# Patient Record
Sex: Female | Born: 1980 | Race: Black or African American | Hispanic: No | Smoking: Never smoker
Health system: Southern US, Community
[De-identification: ages and names within clinical notes are randomized; demographics above are authoritative.]

## PROBLEM LIST (undated history)

## (undated) ENCOUNTER — Inpatient Hospital Stay (HOSPITAL_COMMUNITY): Payer: Self-pay

## (undated) DIAGNOSIS — R7689 Other specified abnormal immunological findings in serum: Secondary | ICD-10-CM

## (undated) DIAGNOSIS — R635 Abnormal weight gain: Secondary | ICD-10-CM

## (undated) DIAGNOSIS — O24419 Gestational diabetes mellitus in pregnancy, unspecified control: Secondary | ICD-10-CM

## (undated) DIAGNOSIS — R768 Other specified abnormal immunological findings in serum: Secondary | ICD-10-CM

## (undated) DIAGNOSIS — Z975 Presence of (intrauterine) contraceptive device: Secondary | ICD-10-CM

## (undated) DIAGNOSIS — G43909 Migraine, unspecified, not intractable, without status migrainosus: Secondary | ICD-10-CM

## (undated) DIAGNOSIS — Z3046 Encounter for surveillance of implantable subdermal contraceptive: Secondary | ICD-10-CM

## (undated) HISTORY — DX: Gestational diabetes mellitus in pregnancy, unspecified control: O24.419

## (undated) HISTORY — DX: Other specified abnormal immunological findings in serum: R76.89

## (undated) HISTORY — DX: Presence of (intrauterine) contraceptive device: Z97.5

## (undated) HISTORY — PX: OTHER SURGICAL HISTORY: SHX169

## (undated) HISTORY — DX: Encounter for surveillance of implantable subdermal contraceptive: Z30.46

## (undated) HISTORY — DX: Other specified abnormal immunological findings in serum: R76.8

## (undated) HISTORY — DX: Migraine, unspecified, not intractable, without status migrainosus: G43.909

## (undated) HISTORY — DX: Abnormal weight gain: R63.5

---

## 2001-12-16 ENCOUNTER — Encounter: Payer: Self-pay | Admitting: *Deleted

## 2001-12-16 ENCOUNTER — Ambulatory Visit (HOSPITAL_COMMUNITY): Admission: RE | Admit: 2001-12-16 | Discharge: 2001-12-16 | Payer: Self-pay | Admitting: *Deleted

## 2002-01-30 ENCOUNTER — Inpatient Hospital Stay (HOSPITAL_COMMUNITY): Admission: AD | Admit: 2002-01-30 | Discharge: 2002-02-02 | Payer: Self-pay | Admitting: *Deleted

## 2002-07-16 ENCOUNTER — Emergency Department (HOSPITAL_COMMUNITY): Admission: EM | Admit: 2002-07-16 | Discharge: 2002-07-17 | Payer: Self-pay | Admitting: Emergency Medicine

## 2002-07-31 ENCOUNTER — Emergency Department (HOSPITAL_COMMUNITY): Admission: EM | Admit: 2002-07-31 | Discharge: 2002-07-31 | Payer: Self-pay | Admitting: Emergency Medicine

## 2005-10-03 ENCOUNTER — Emergency Department (HOSPITAL_COMMUNITY): Admission: EM | Admit: 2005-10-03 | Discharge: 2005-10-03 | Payer: Self-pay | Admitting: Emergency Medicine

## 2005-12-03 ENCOUNTER — Emergency Department (HOSPITAL_COMMUNITY): Admission: EM | Admit: 2005-12-03 | Discharge: 2005-12-03 | Payer: Self-pay | Admitting: Family Medicine

## 2005-12-07 ENCOUNTER — Emergency Department (HOSPITAL_COMMUNITY): Admission: EM | Admit: 2005-12-07 | Discharge: 2005-12-07 | Payer: Self-pay | Admitting: Family Medicine

## 2005-12-23 ENCOUNTER — Emergency Department (HOSPITAL_COMMUNITY): Admission: EM | Admit: 2005-12-23 | Discharge: 2005-12-23 | Payer: Self-pay | Admitting: Family Medicine

## 2006-04-18 ENCOUNTER — Emergency Department (HOSPITAL_COMMUNITY): Admission: EM | Admit: 2006-04-18 | Discharge: 2006-04-19 | Payer: Self-pay | Admitting: Emergency Medicine

## 2007-01-08 ENCOUNTER — Emergency Department (HOSPITAL_COMMUNITY): Admission: EM | Admit: 2007-01-08 | Discharge: 2007-01-08 | Payer: Self-pay | Admitting: Emergency Medicine

## 2008-02-12 IMAGING — CT CT PELVIS W/ CM
2 of 5 series · 17 of 46 positions shown, 19 images · IV contrast (omnipaque)
Comparison: none

CLINICAL DATA: Stomach and abdominal pain.
ABDOMEN CT WITH CONTRAST:
TECHNIQUE: Multidetector CT imaging of the abdomen was performed following the standard protocol during bolus administration of intravenous contrast.
Contrast:  125 cc Omnipaque 300
TECHNIQUE: Multidetector CT imaging of the pelvis was performed following the standard protocol during bolus administration of intravenous contrast.

[Series 2: abd_pel 5.0 b40f st · axial · 0.60mm/px · z∈[-614,-228]mm · 14 of 87 slices shown, 16 images]
[im 5/87  soft-tissue]
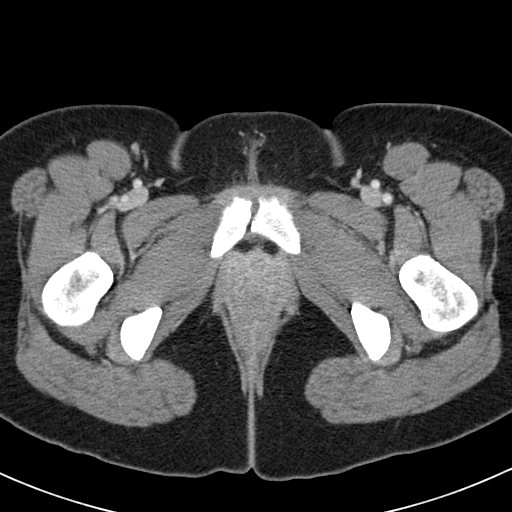
[im 5/87  bone]
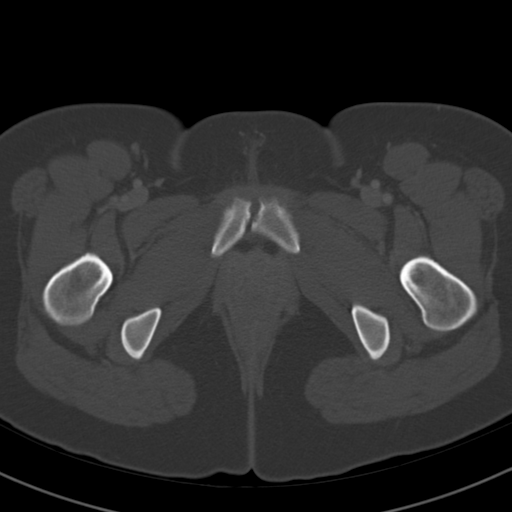
[im 13/87  soft-tissue]
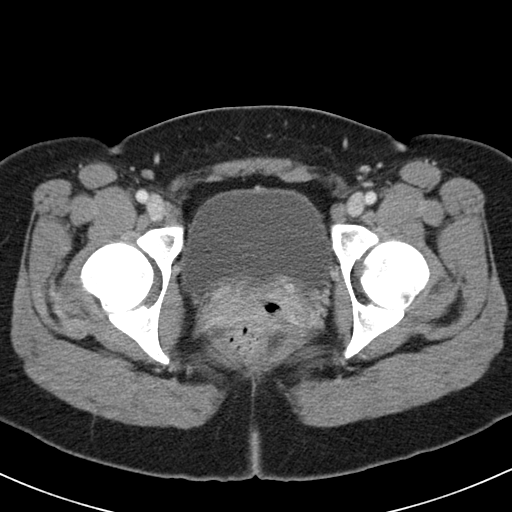
[im 18/87  soft-tissue]
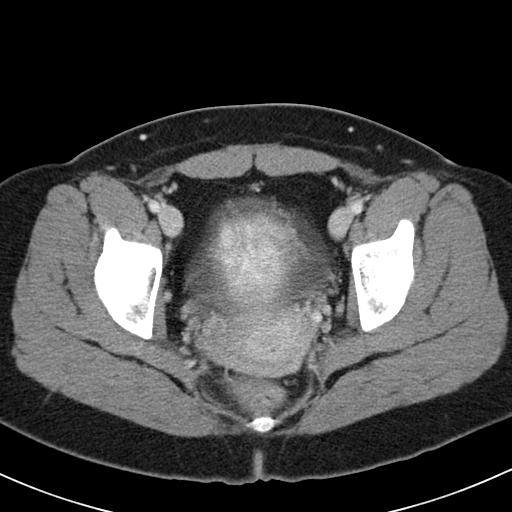
[im 22/87  soft-tissue]
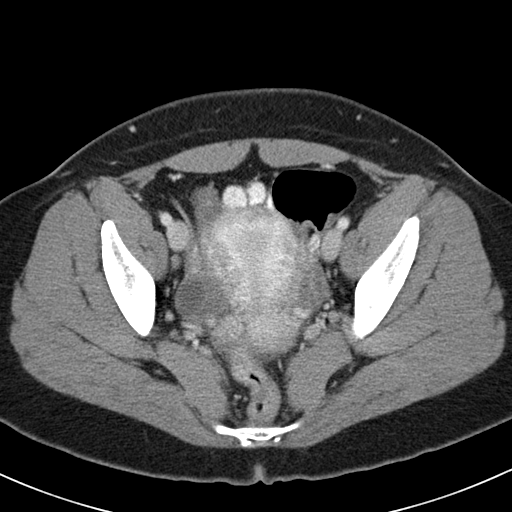
[im 31/87  soft-tissue]
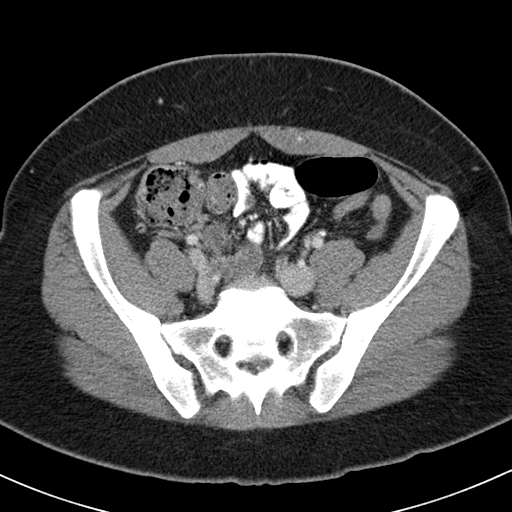
[im 35/87  soft-tissue]
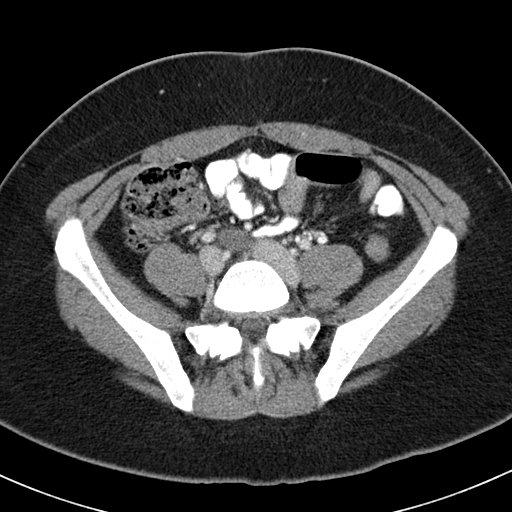
[im 39/87  soft-tissue]
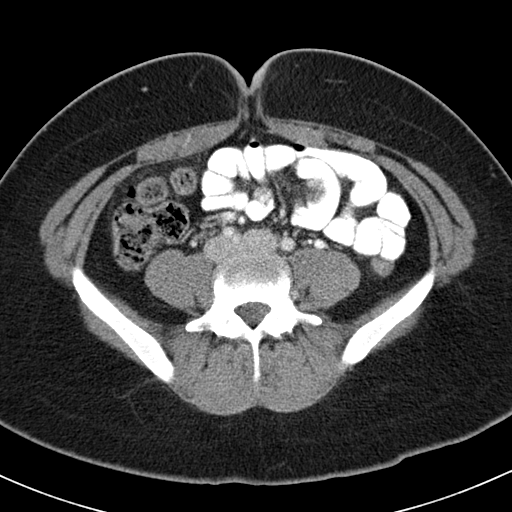
[im 48/87  soft-tissue]
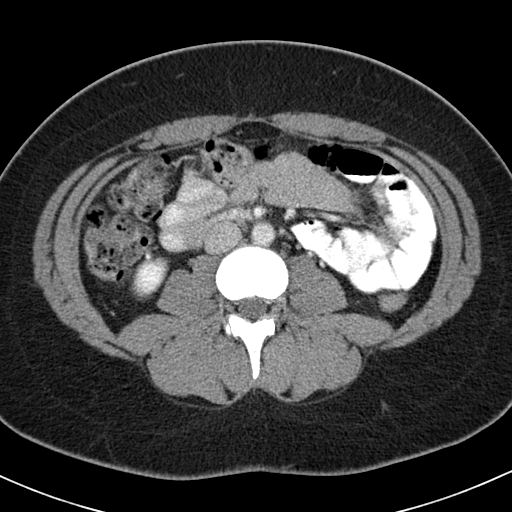
[im 52/87  soft-tissue]
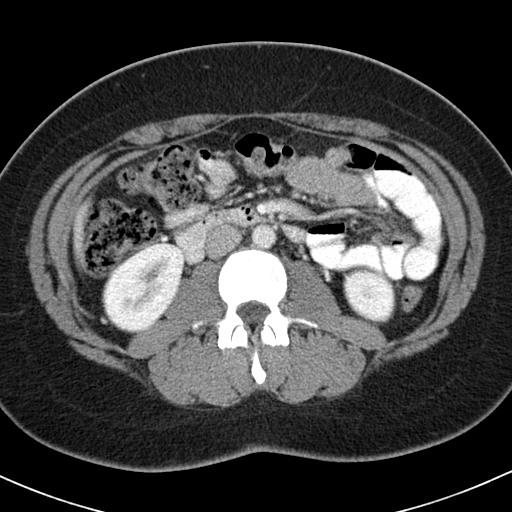
[im 52/87  bone]
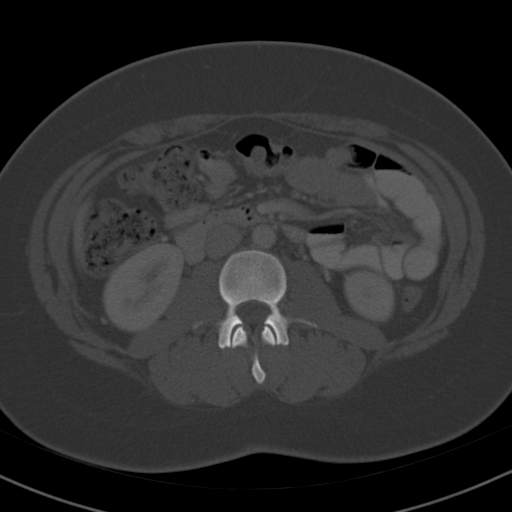
[im 56/87  soft-tissue]
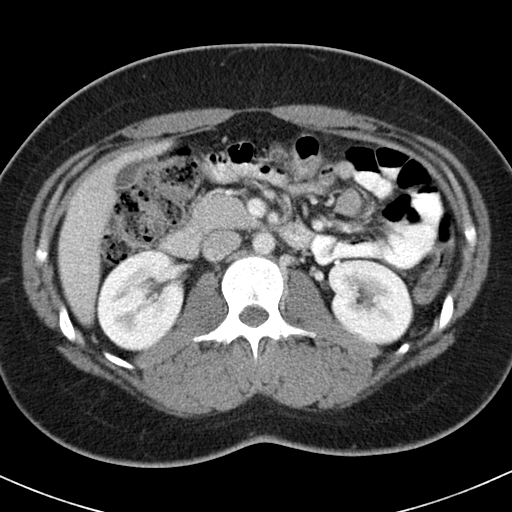
[im 65/87  soft-tissue]
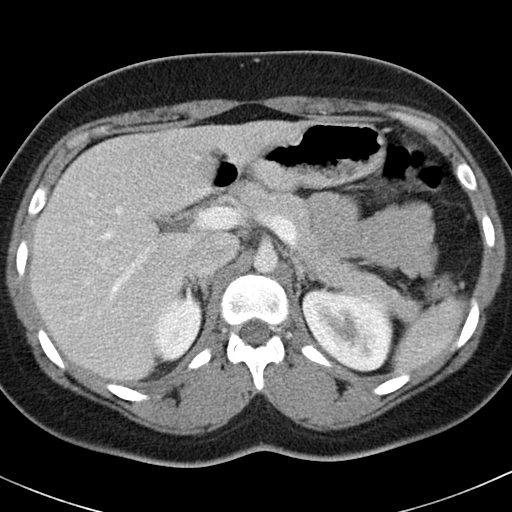
[im 69/87  soft-tissue]
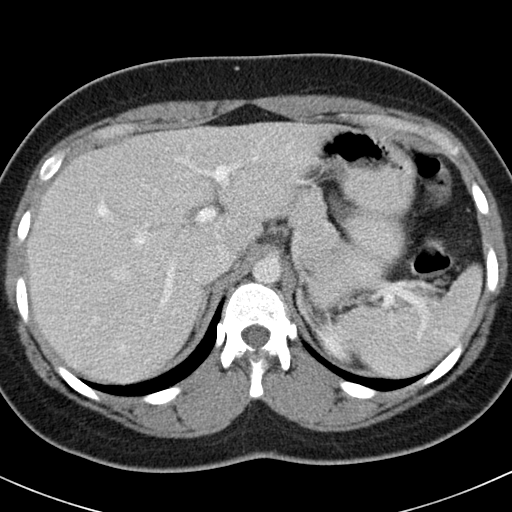
[im 74/87  soft-tissue]
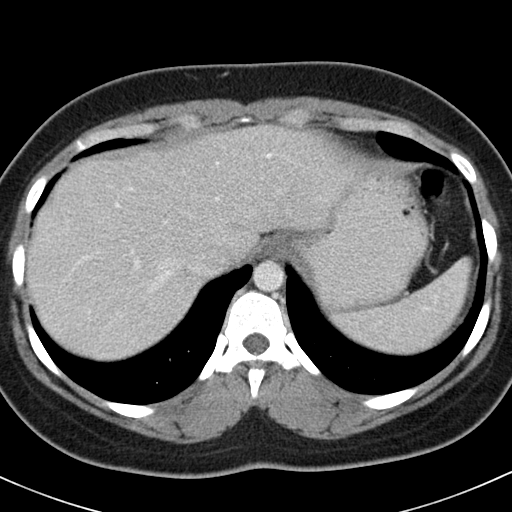
[im 82/87  soft-tissue]
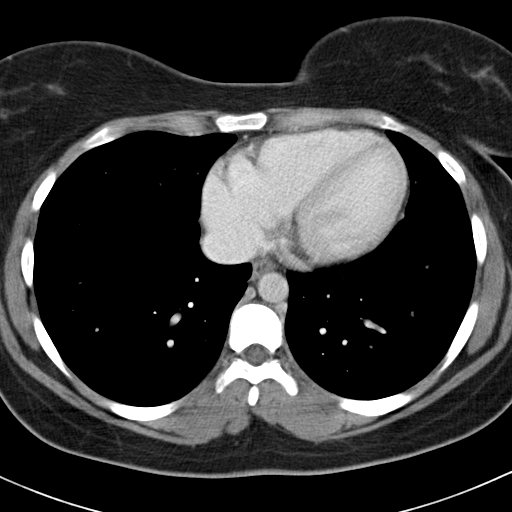

[Series 602: <mpr range> · coronal · 0.88mm/px · 3 of 66 slices shown]
[im 22/66  soft-tissue]
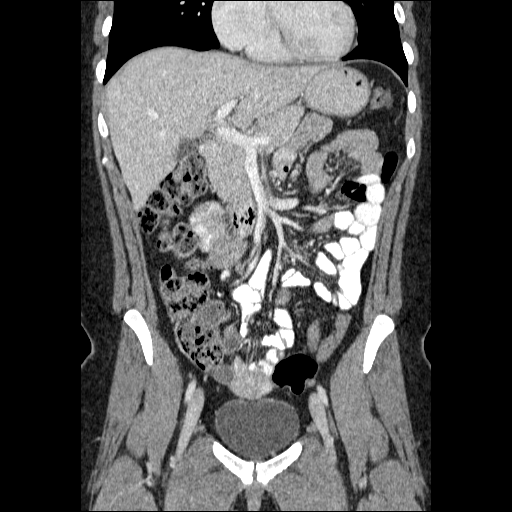
[im 29/66  soft-tissue]
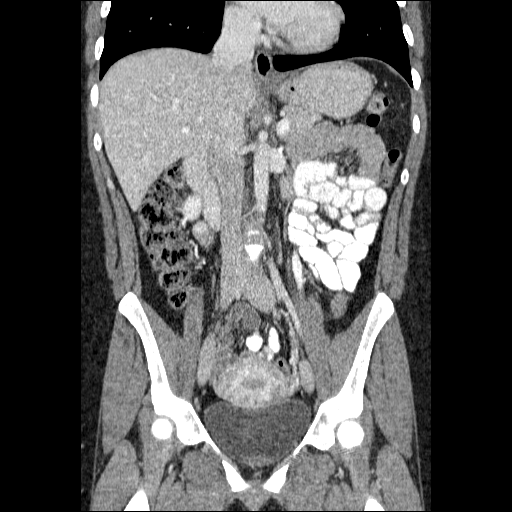
[im 37/66  soft-tissue]
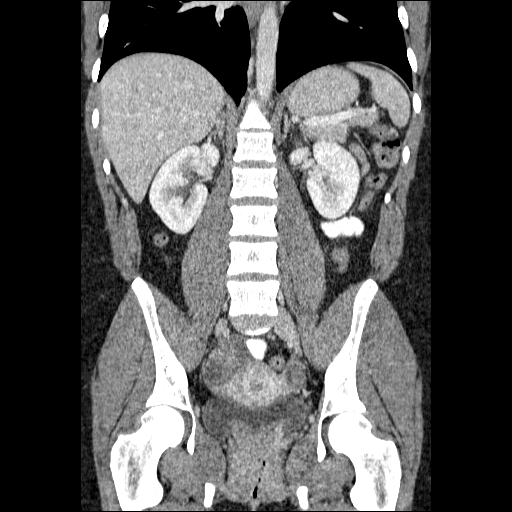

[17 of 46 positions shown; findings below may reference images not displayed]

FINDINGS: The lung bases are clear. Negative for free air.  Liver, gallbladder, portal venous system, spleen, and small accessory spleens are within normal limits. The duodenum, pancreas, and stomach are within normal limits.  Normal appearance of the adrenal glands and kidneys.  Negative for free fluid or lymphadenopathy. Appendix has a normal appearance.  No acute bone abnormalities.
IMPRESSION: Negative CT of the abdomen.
PELVIS CT WITH CONTRAST:
FINDINGS: Ill-defined ovoid structure involving the right adnexa measures 2.5 cm with low density.  Findings consistent with a small cyst.  There is a moderate amount of fluid within the endometrial canal.  Left adnexa has a normal appearance.  No significant free fluid or lymphadenopathy. Urinary bladder is within normal limits. No acute bone abnormalities.
IMPRESSION: 1.  2.5 cm cystic structure involving the right adnexa.  This could be followed with ultrasound.
2.  No acute findings in the pelvis.

## 2009-02-04 ENCOUNTER — Inpatient Hospital Stay (HOSPITAL_COMMUNITY): Admission: AD | Admit: 2009-02-04 | Discharge: 2009-02-04 | Payer: Self-pay | Admitting: Obstetrics & Gynecology

## 2009-08-10 ENCOUNTER — Ambulatory Visit: Payer: Self-pay | Admitting: Obstetrics and Gynecology

## 2009-08-10 ENCOUNTER — Inpatient Hospital Stay (HOSPITAL_COMMUNITY): Admission: AD | Admit: 2009-08-10 | Discharge: 2009-08-11 | Payer: Self-pay | Admitting: Obstetrics and Gynecology

## 2009-09-13 ENCOUNTER — Inpatient Hospital Stay (HOSPITAL_COMMUNITY): Admission: AD | Admit: 2009-09-13 | Discharge: 2009-09-16 | Payer: Self-pay | Admitting: Obstetrics and Gynecology

## 2010-05-10 LAB — COMPREHENSIVE METABOLIC PANEL
ALT: 30 U/L (ref 0–35)
ALT: 66 U/L — ABNORMAL HIGH (ref 0–35)
AST: 30 U/L (ref 0–37)
AST: 38 U/L — ABNORMAL HIGH (ref 0–37)
AST: 92 U/L — ABNORMAL HIGH (ref 0–37)
Albumin: 2.4 g/dL — ABNORMAL LOW (ref 3.5–5.2)
Albumin: 2.8 g/dL — ABNORMAL LOW (ref 3.5–5.2)
Alkaline Phosphatase: 103 U/L (ref 39–117)
Alkaline Phosphatase: 143 U/L — ABNORMAL HIGH (ref 39–117)
BUN: 2 mg/dL — ABNORMAL LOW (ref 6–23)
BUN: 4 mg/dL — ABNORMAL LOW (ref 6–23)
CO2: 22 mEq/L (ref 19–32)
CO2: 26 mEq/L (ref 19–32)
Calcium: 8.9 mg/dL (ref 8.4–10.5)
Calcium: 9 mg/dL (ref 8.4–10.5)
Chloride: 105 mEq/L (ref 96–112)
Chloride: 108 mEq/L (ref 96–112)
Creatinine, Ser: 0.51 mg/dL (ref 0.4–1.2)
Creatinine, Ser: 0.6 mg/dL (ref 0.4–1.2)
GFR calc Af Amer: >60 mL/min (ref >60)
GFR calc Af Amer: >60 mL/min (ref >60)
GFR calc Af Amer: >60 mL/min (ref >60)
GFR calc non Af Amer: >60 mL/min (ref >60)
GFR calc non Af Amer: >60 mL/min (ref >60)
Glucose, Bld: 108 mg/dL — ABNORMAL HIGH (ref 70–99)
Potassium: 3.5 mEq/L (ref 3.5–5.1)
Potassium: 3.8 mEq/L (ref 3.5–5.1)
Sodium: 132 mEq/L — ABNORMAL LOW (ref 135–145)
Sodium: 136 mEq/L (ref 135–145)
Total Bilirubin: 0.1 mg/dL — ABNORMAL LOW (ref 0.3–1.2)
Total Bilirubin: 0.2 mg/dL — ABNORMAL LOW (ref 0.3–1.2)
Total Protein: 6 g/dL (ref 6.0–8.3)
Total Protein: 7.2 g/dL (ref 6.0–8.3)

## 2010-05-10 LAB — CBC
HCT: 34.4 % — ABNORMAL LOW (ref 36.0–46.0)
HCT: 41.4 % (ref 36.0–46.0)
Hemoglobin: 11.7 g/dL — ABNORMAL LOW (ref 12.0–15.0)
Hemoglobin: 13.8 g/dL (ref 12.0–15.0)
Hemoglobin: 14 g/dL (ref 12.0–15.0)
Hemoglobin: 15.9 g/dL — ABNORMAL HIGH (ref 12.0–15.0)
MCH: 29.4 pg (ref 26.0–34.0)
MCH: 29.6 pg (ref 26.0–34.0)
MCH: 29.8 pg (ref 26.0–34.0)
MCH: 29.9 pg (ref 26.0–34.0)
MCH: 30.2 pg (ref 26.0–34.0)
MCHC: 32.7 g/dL (ref 30.0–36.0)
MCHC: 33.4 g/dL (ref 30.0–36.0)
MCHC: 33.4 g/dL (ref 30.0–36.0)
MCV: 88.8 fL (ref 78.0–100.0)
MCV: 89.3 fL (ref 78.0–100.0)
MCV: 89.6 fL (ref 78.0–100.0)
MCV: 90.1 fL (ref 78.0–100.0)
MCV: 90.3 fL (ref 78.0–100.0)
Platelets: 134 10*3/uL — ABNORMAL LOW (ref 150–400)
Platelets: 144 10*3/uL — ABNORMAL LOW (ref 150–400)
Platelets: 150 10*3/uL (ref 150–400)
Platelets: 174 10*3/uL (ref 150–400)
RBC: 3.91 MIL/uL (ref 3.87–5.11)
RBC: 4.64 MIL/uL (ref 3.87–5.11)
RBC: 4.72 MIL/uL (ref 3.87–5.11)
RDW: 14.2 % (ref 11.5–15.5)
RDW: 14.5 % (ref 11.5–15.5)
RDW: 14.6 % (ref 11.5–15.5)
RDW: 14.8 % (ref 11.5–15.5)
WBC: 5.2 10*3/uL (ref 4.0–10.5)
WBC: 5.9 10*3/uL (ref 4.0–10.5)
WBC: 6.6 10*3/uL (ref 4.0–10.5)
WBC: 6.7 10*3/uL (ref 4.0–10.5)

## 2010-05-10 LAB — URINALYSIS, DIPSTICK ONLY
Glucose, UA: NEGATIVE mg/dL
Specific Gravity, Urine: 1.025 (ref 1.005–1.030)
Urobilinogen, UA: 0.2 mg/dL (ref 0.0–1.0)

## 2010-05-10 LAB — RPR: RPR Ser Ql: NONREACTIVE

## 2010-05-10 LAB — PROTEIN / CREATININE RATIO, URINE
Creatinine, Urine: 43.3 mg/dL
Total Protein, Urine: 565 mg/dL

## 2010-05-11 LAB — URINALYSIS, ROUTINE W REFLEX MICROSCOPIC
Bilirubin Urine: NEGATIVE
Hgb urine dipstick: NEGATIVE
Ketones, ur: 15 mg/dL — AB
Nitrite: NEGATIVE
Protein, ur: NEGATIVE mg/dL
Specific Gravity, Urine: 1.015 (ref 1.005–1.030)
Urobilinogen, UA: 0.2 mg/dL (ref 0.0–1.0)

## 2010-05-11 LAB — GLUCOSE, CAPILLARY: Glucose-Capillary: 114 mg/dL — ABNORMAL HIGH (ref 70–99)

## 2010-05-11 LAB — CBC
MCHC: 32.8 g/dL (ref 30.0–36.0)
MCV: 89.7 fL (ref 78.0–100.0)
Platelets: 200 10*3/uL (ref 150–400)
RDW: 13.4 % (ref 11.5–15.5)

## 2010-05-27 LAB — POCT PREGNANCY, URINE: Preg Test, Ur: POSITIVE

## 2010-05-27 LAB — URINALYSIS, ROUTINE W REFLEX MICROSCOPIC
Bilirubin Urine: NEGATIVE
Glucose, UA: NEGATIVE mg/dL
Hgb urine dipstick: NEGATIVE
Protein, ur: NEGATIVE mg/dL
Urobilinogen, UA: 0.2 mg/dL (ref 0.0–1.0)

## 2010-05-27 LAB — HCG, QUANTITATIVE, PREGNANCY: hCG, Beta Chain, Quant, S: 86079 m[IU]/mL — ABNORMAL HIGH (ref <5)

## 2010-05-27 LAB — WET PREP, GENITAL
Trich, Wet Prep: NONE SEEN
Yeast Wet Prep HPF POC: NONE SEEN

## 2010-05-27 LAB — ABO/RH: ABO/RH(D): A POS

## 2010-05-27 LAB — GC/CHLAMYDIA PROBE AMP, GENITAL
Chlamydia, DNA Probe: NEGATIVE
GC Probe Amp, Genital: NEGATIVE

## 2010-05-27 LAB — CBC
RBC: 4.27 MIL/uL (ref 3.87–5.11)
WBC: 5.4 10*3/uL (ref 4.0–10.5)

## 2010-07-11 NOTE — Op Note (Signed)
[name], [name]                         ACCOUNT NO.:  [id number]   MEDICAL RECORD NO.:  [id number]                  PATIENT TYPE:   LOCATION:                                       FACILITY:   PHYSICIAN:  Langley Gauss, M.D.                DATE OF BIRTH:   DATE OF PROCEDURE:  01/31/2002  DATE OF DISCHARGE:                                 OPERATIVE REPORT   OBSTETRICAL PROCEDURE NOTE:   PROCEDURE:  Placement of continuous lumbar epidural analgesia at the L3-L4  interspace performed by Dr. Roylene Reason. [name].   COMPLICATIONS:  None.   DESCRIPTION OF PROCEDURE:  An appropriate and informed consent was obtained.  Continuous electronic fetal monitoring was performed.  The patient was  placed in the seated position at which time bony landmarks were identified.  The L3-L4 interspace was chosen.  The patient's back is sterilely prepped  and draped utilizing  epidural kit; 5 cc of 1% lidocaine injected at the  midline of the L3-L4 interspace to raise this site to raise a small skin  wheal.   The 17-gauge Touhy-Schliff needle was then utilized with loss of resistance  in air-filled glass syringe to identify the epidural space.  On the first  attempt the bony spinous process was encountered, thus the epidural needle  was withdrawn and directed slightly caudad.  On the second attempt there was  excellent loss of resistance consistent with entry into the epidural space.  Initial test dose of 5 cc of 1.5% lidocaine plus epinephrine was then  injected through the epidural needle.  No signs of CSF or intravascular  injection obtained.   The epidural catheter was then inserted to a depth of 5 cm.  Epidural needle  was removed.  Aspiration test was negative.  A second test dose of 2 cc of  1.5% lidocaine plus epinephrine injected through the catheter.  Again, no  signs of CSF or intravascular injection obtained.   The patient is having warmth in the feet consistent with a properly  setting  up epidural block.  Thus, she was connected to the infusion pump containing  the standard mixture.  She will be treated with a bolus of 10 cc followed by  continuous infusion at a rate of 14 cc/hour.  Upon completion of the  procedure the patient was examined and noted to be 7 cm dilated, 80%  effaced, with the vertex at a 0 station.  External fetal monitor reveals  uterine contractions to be inadequate with contraction frequency of every 3-  7 minutes.  The fetal heart rate remains reassuring.  \   ASSESSMENT:  Epidural placed successfully.   PLAN:  Continue to increase Pitocin to achieve functional active labor  pattern.  Langley Gauss, M.D.   DC/MEDQ  D:  01/31/2002  T:  01/31/2002  Job:  [id number]

## 2010-07-11 NOTE — Op Note (Signed)
   NAMEBETRICE, WANAT                         ACCOUNT NO.:  [id number]   MEDICAL RECORD NO.:  [id number]                  PATIENT TYPE:   LOCATION:                                       FACILITY:   PHYSICIAN:  Langley Gauss, M.D.                DATE OF BIRTH:   DATE OF PROCEDURE:  01/30/2002  DATE OF DISCHARGE:                                 OPERATIVE REPORT   OBSTETRICAL PROCEDURE NOTE:   PROCEDURE:  Placement of Foley bulb for mechanical ripening of cervix prior  to induction.   SURGEON:  Roylene Reason. [name], M.D.   COMPLICATIONS:  None.   DESCRIPTION OF PROCEDURE:  The patient was placed in a labor bed at which  time she was noted to have a reactive nonstress test with only rare uterine  contractions.  The patient was then placed in the dorsal lithotomy position.  Sterile speculum examination reveals the cervix to be about fingertip  dilated, very long and thick.  Utilizing sterile technique, then a soft 16  French Foley catheter is passed through the endocervical os into the lower  uterine segment without difficulty at which time it is inflated to a volume  of 50 cc of sterile normal saline.  Gentle traction then confirms its proper  placement.  The patient is then taken out of dorsal lithotomy position,  returned to the left lateral decubitus position.  The Foley catheter is then  taped to her left leg on gentle traction.  Nonstress test in progress after  the completion of the procedure continues to show a reassuring fetal heart  rate with a reactive stress test.  Uterine contractions are very mild but  showing up on the monitor at every 3-5 minutes.   PLAN:  This will be left in place throughout the night.  It will be removed  in the early a.m. at which time Pitocin will be initiated and hopefully  amniotomy can be performed after cervical ripening.                                                Langley Gauss, M.D.    DC/MEDQ  D:  01/31/2002  T:  01/31/2002   Job:  [id number]

## 2010-07-11 NOTE — H&P (Signed)
   NAMEAJANAE, VIRAG                       ACCOUNT NO.:  [id number]   MEDICAL RECORD NO.:  [id number]                   PATIENT TYPE:  INP   LOCATION:  A417                                 FACILITY:  [facility]   PHYSICIAN:  Langley Gauss, M.D.                DATE OF BIRTH:  October 26, 1980   DATE OF ADMISSION:  01/30/2002  DATE OF DISCHARGE:  02/02/2002                                HISTORY & PHYSICAL   HISTORY OF PRESENT ILLNESS:  This is a 30 year old gravida 1, para 0 at 77-  [redacted] weeks gestation who was admitted for Foley bulb ripening of cervix  followed by induction of labor.  The patient's prenatal course has been  complicated only by a late transfer to our office at [redacted] weeks gestation.  The patient, however, did have previous prenatal care in [place] with a 23-  week ultrasound.  The patient has during the prenatal course experienced an  episodic toothache for which she was treated with antibiotics, as well as  hydrocodone.  The patient does have plans for a definitive dental procedure  following delivery.  The patient also had experienced PUPP lesions which  responded well to a topical steroid.   ALLERGIES:  No known drug allergies.   PAST MEDICAL HISTORY/PAST SURGICAL HISTORY:  Negative.   SOCIAL HISTORY:  She is a nonsmoker.   PHYSICAL EXAMINATION:  VITAL SIGNS:  Height is 5 feet 4 inches, 132 pounds  prenatally, 177 pounds at time of delivery.  Blood pressure 129/75, pulse  rate of 80, respiratory rate is 20.  HEENT:  Negative.  No adenopathy.  NECK:  Supple.  Thyroid is nonpalpable.  LUNGS:  Clear.  CARDIOVASCULAR:  Regular rate and rhythm.  ABDOMEN:  No surgical scars are identified.  The fundal height is 38 cm.  She is vertex presentation by Leopold's maneuvers.  PELVIC:  Noted to be clinically adequate upon clinical pelvimetry.   ASSESSMENT:  A 40-41 week intrauterine pregnancy by last menstrual period,  confirmed by both a 23- and a 34-week ultrasound.   Diagnosis pending  postdates, thus we will proceed with Foley bulb mechanical ripening of  cervix due to the unfavorable nature of the cervix, currently only fingertip  dilated.  After removal of the Foley, we will proceed with amniotomy and  induction of labor.                                               Langley Gauss, M.D.    DC/MEDQ  D:  02/07/2002  T:  02/07/2002  Job:  [id number]

## 2010-07-11 NOTE — Op Note (Signed)
[name], [name]                         ACCOUNT NO.:  [id number]   MEDICAL RECORD NO.:  [id number]                  PATIENT TYPE:   LOCATION:  417                                  FACILITY:   PHYSICIAN:  Langley Gauss, M.D.                DATE OF BIRTH:   DATE OF PROCEDURE:  DATE OF DISCHARGE:                                  PROCEDURE NOTE   DELIVERY NOTE:   DIAGNOSIS:  A 41-week intrauterine pregnancy for induction of labor.   DELIVERY PERFORMED:  1. Spontaneous assisted vaginal delivery of a 7 pound 5 once female infant.  2. Midline episiotomy repair.   Delivery performed by Dr. Roylene Reason. [name].   ANALGESIA:  Continue lumbar epidural placed by Dr. [name] during the course  of labor.  This was supplemented with 30 cc of 1% lidocaine in the midline  of the perineal body as well as posterior vaginal wall.   SPECIMENS:  1. Arterial cord gas and cord blood are sent to pathology laboratory.  2. The placenta is examined and noted to be apparently intake with a 3-     vessel umbilical cord at the time of delivery.   TOTAL ESTIMATED BLOOD LOSS:  Less than 500 cc.   SUMMARY:  The patient had been admitted on 01/30/02 for induction of labor  secondary to a 41-week gestation.  Foley bulb catheter had been placed for  mechanical ripening of the cervix.  Pitocin was initiated and the Foley bulb  catheter was removed on 01/31/02.  At that time the patient was noted to be  4 cm dilated.  After initiation of Pitocin and onset of an active labor  pattern, amniotomy was performed with the findings of clear amniotic fluid.  A fetal scalp electrode was placed.  Therefore, Pitocin was increased up to  a maximum of 13 milliunits per minute.  With the discomfort associated with  the uterine contractions the patient requested epidural analgesic.  Epidural  was placed without complications and functioned very well throughout the  course of labor.   After achieving a functional  labor pattern, the patient progressed normally  along the labor curve.  At 9 cm dilatation she was noted to have discomfort  associated with the uterine contractions and she was treated, at that time,  with a single bolus of 10 cc of 2% lidocaine.  This allowed her to be  managed expectantly until she reached complete dilatation.  She was,  thereafter, examined and noted to be completely dilated with the vertex at a  +2 station. At that point in time she was placed in the dorsal lithotomy  position, prepped and draped in the usual manner.  Attention to the perineum  with descent to the vertex occurred.  A small midline episiotomy was then  performed.  The infant was noted to deliver in a direct OA position over  this midline episiotomy without extension.  The  mouth and nares of the  infant were bulb suctioned of clear amniotic fluid.  Renewed expulsive  efforts resulted in a spontaneous rotation to a right anterior shoulder  position.  Gentle downward traction combined with expulsive efforts resulted  in delivery of this anterior shoulder as well as the remainder of the infant  without difficulty.  A spontaneous and vigorous breathing cry is noted at  this time.  The umbilical cord was then milked towards the infant.  The cord  was doubly clamped and cut.  The infant was taken to the nursery table for  immediate assessment.  Arterial cord gases and cord blood are obtained.   Gentle traction on the umbilical cord results in separation which, upon  examination, appears to be an intact placenta with an attached 3-vessel  cord.  Excellent uterine tone was achieved by performing fundal massage. The  episiotomy is repair utilizing #0 chromic in a running lock fashion on the  vaginal mucosa followed by a 2-layer closure of #0 chromic on the perineal  body.   Following delivery the patient was taken out of the dorsal lithotomy  position.  She was rolled to her side, at which time the epidural  catheter  is removed with the blue tip noted to be intact.  There are no  complications.                                               Langley Gauss, M.D.    DC/MEDQ  D:  01/31/2002  T:  01/31/2002  Job:  [id number]

## 2010-07-11 NOTE — Discharge Summary (Signed)
   [name], [name]                       ACCOUNT NO.:  [id number]   MEDICAL RECORD NO.:  [id number]                   PATIENT TYPE:  INP   LOCATION:  A417                                 FACILITY:  [facility]   PHYSICIAN:  Langley Gauss, M.D.                DATE OF BIRTH:  Jun 12, 1980   DATE OF ADMISSION:  01/30/2002  DATE OF DISCHARGE:  02/02/2002                                 DISCHARGE SUMMARY   DIAGNOSIS:  Forty-one-week intrauterine pregnancy with impending postdates.   PROCEDURES:  1. January 30, 2002 placement of Foley bulb for mechanical ripening of the     cervix.  2. January 31, 2002 placement of continuous lumbar epidural analgesia.  3. Spontaneous assisted vaginal delivery of 7-pound 5-ounce female infant.  4. Midline episiotomy repair.   DISPOSITION AT TIME OF DISCHARGE:  The patient is to follow up in the office  in 4 weeks' time.   DISCHARGE MEDICATIONS:  Tylenol No. 3 and Motrin for pain relief.   PERTINENT LABORATORY STUDIES:  Hemoglobin and hematocrit 11.0/33.8 with a  white count of 5.3.  Postpartum day #1 hemoglobin 9.8, hematocrit 30.1, with  a white count of 8.9.   HOSPITAL COURSE:  See previous dictations.  The patient had an unfavorable  cervix thus on January 30, 2002 Foley bulb was placed, this was removed on  January 31, 2002 at which time amniotomy was performed, thereafter Pitocin  induction was initiated.  With onset of uncomfortable uterine contractions,  the patient requested epidural.  Epidural was placed without complications.  She did very well throughout the remainder of the labor course.  The patient  thereafter progressed rapidly along the labor curve to an uneventful  uncomplicated delivery which occurred on January 31, 2002.  Postpartum the  patient did very well, she bonded well with the infant, had no excessive  bleeding and thus she was discharged home on February 02, 2002.                                               Langley Gauss, M.D.    DC/MEDQ  D:  02/07/2002  T:  02/09/2002  Job:  [id number]

## 2010-11-30 IMAGING — US US OB COMP LESS 14 WK
1 series · 14 of 23 positions shown · non-contrast
Comparison: None

CLINICAL DATA: 28-year-old pregnant female with pelvic pain and
cramping.

OBSTETRIC <14 WK ULTRASOUND
TECHNIQUE: Transabdominal ultrasound was performed for evaluation
of the gestation as well as the maternal uterus and adnexal
regions.

[Series 1: us ob comp less 14 wks · 0.22mm/px · 14 of 23 slices shown]
[im 1/23]
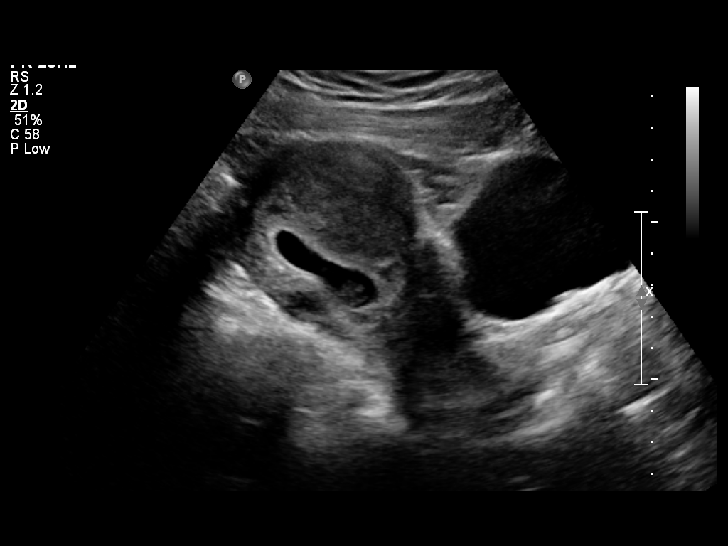
[im 3/23]
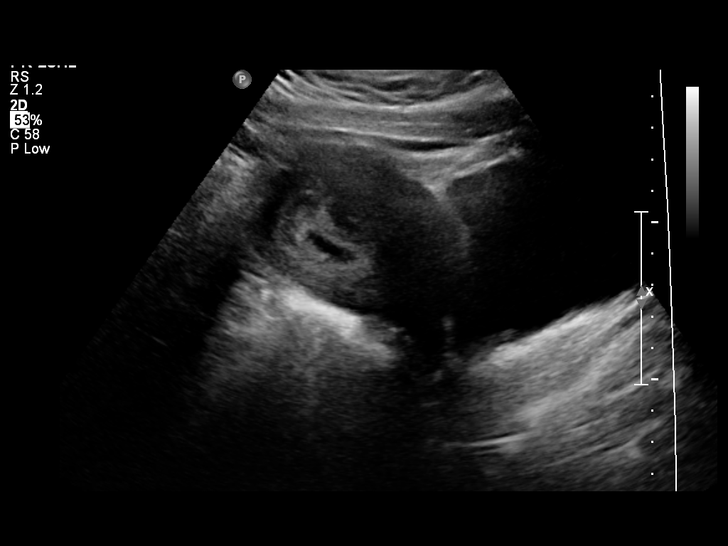
[im 5/23]
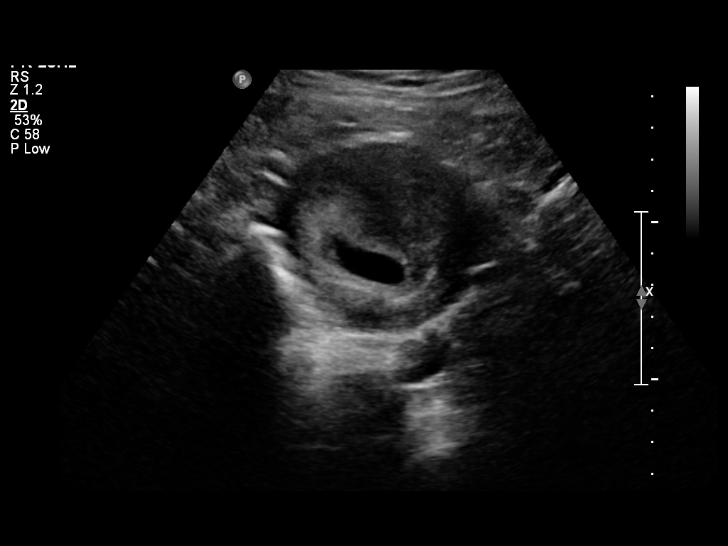
[im 6/23]
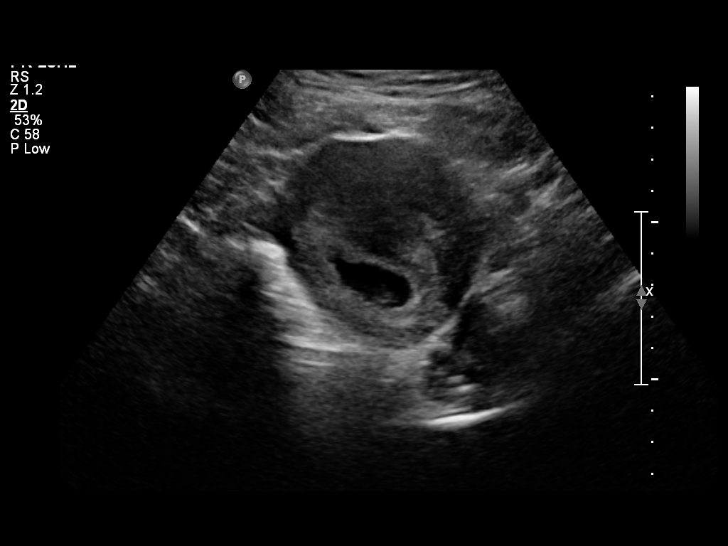
[im 8/23]
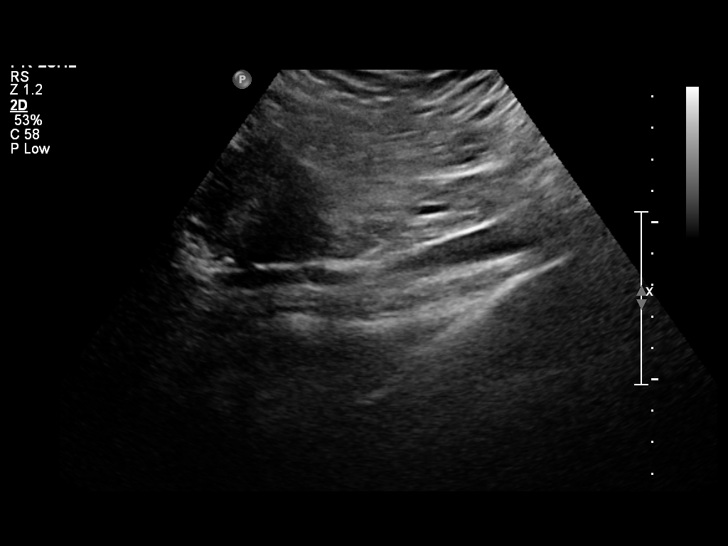
[im 10/23]
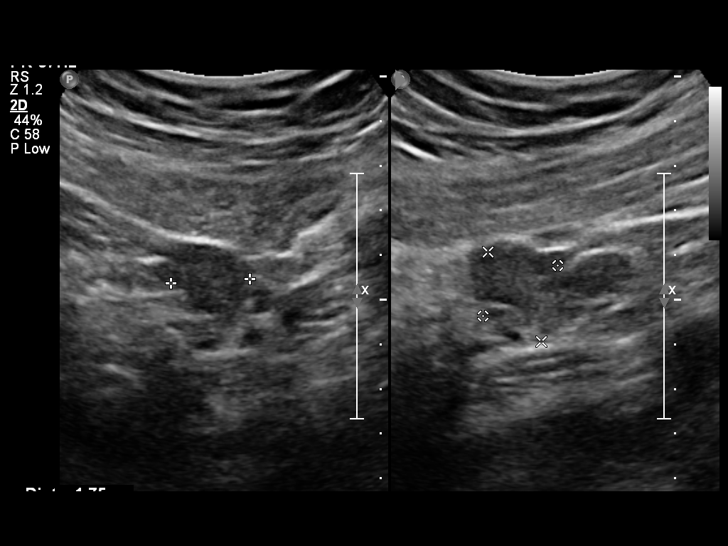
[im 11/23]
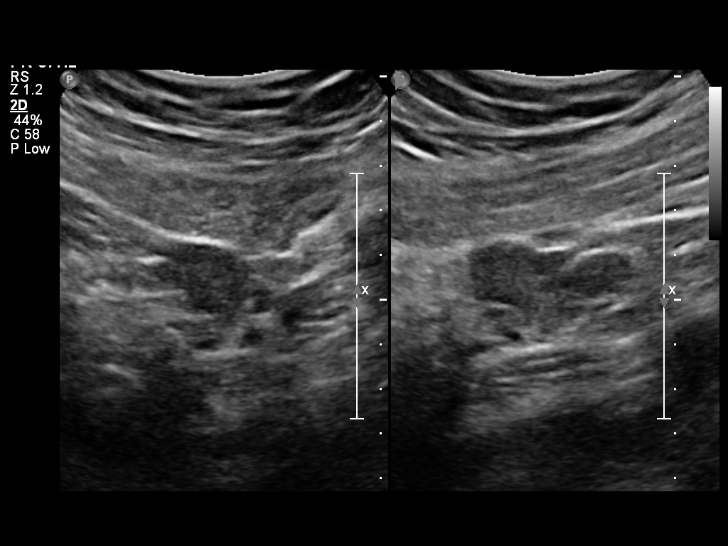
[im 13/23]
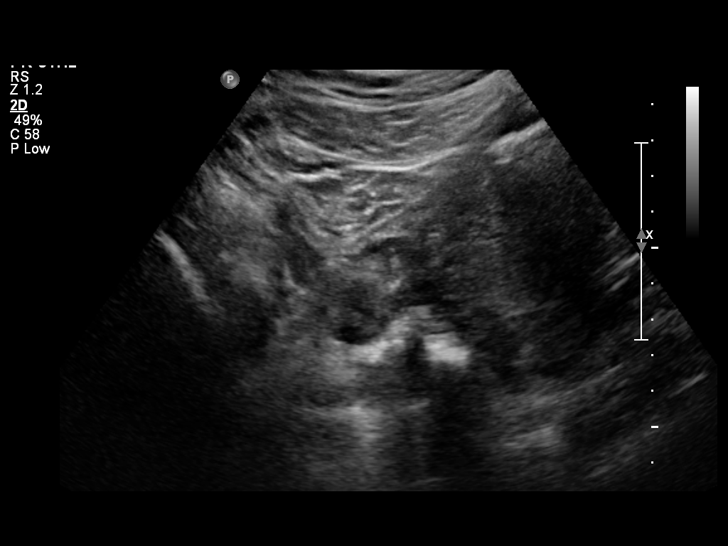
[im 14/23]
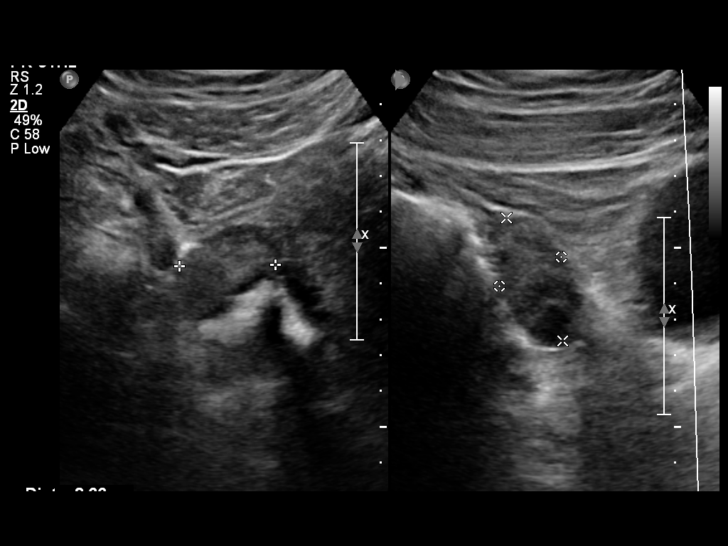
[im 16/23]
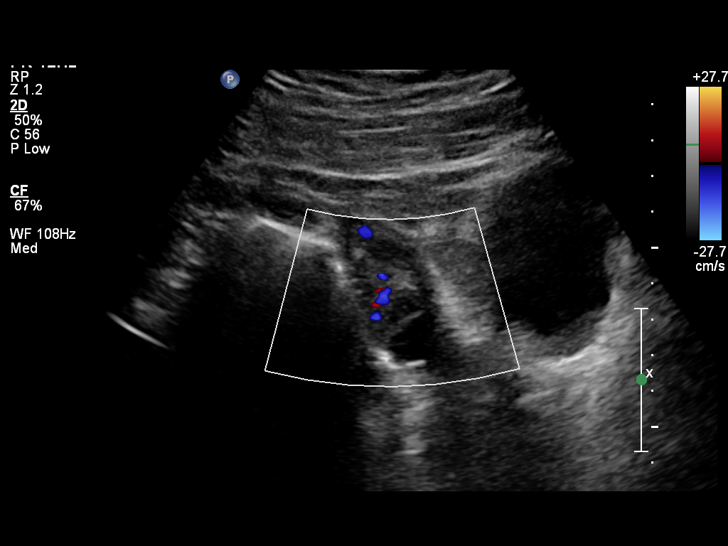
[im 18/23]
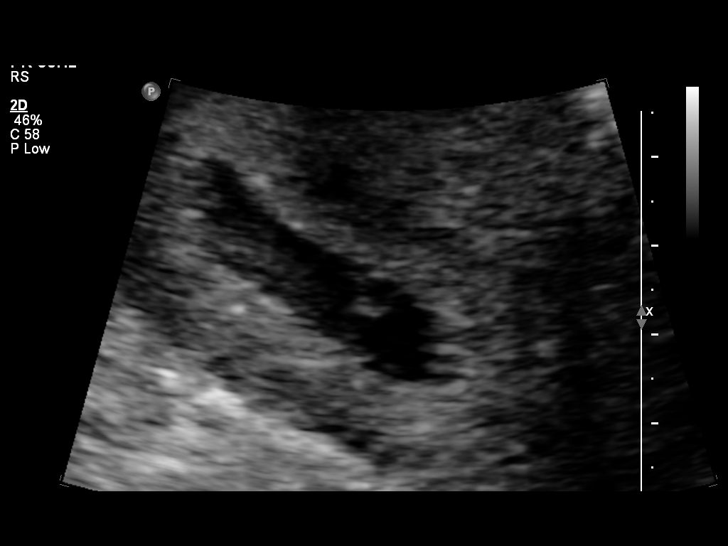
[im 19/23]
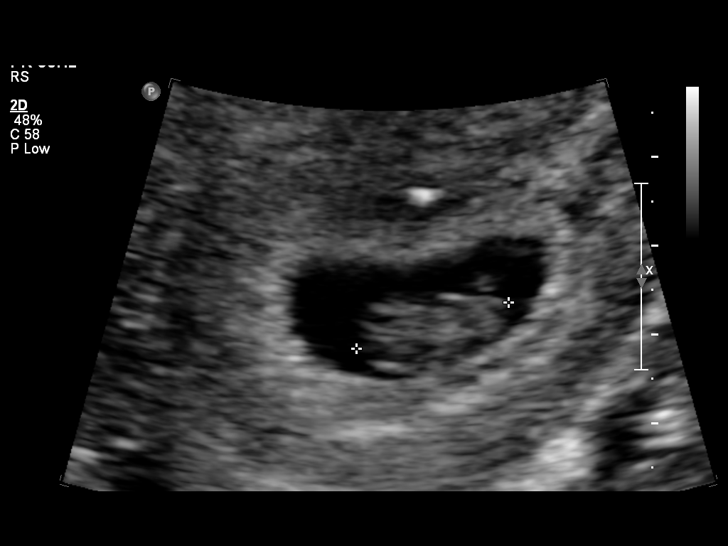
[im 21/23]
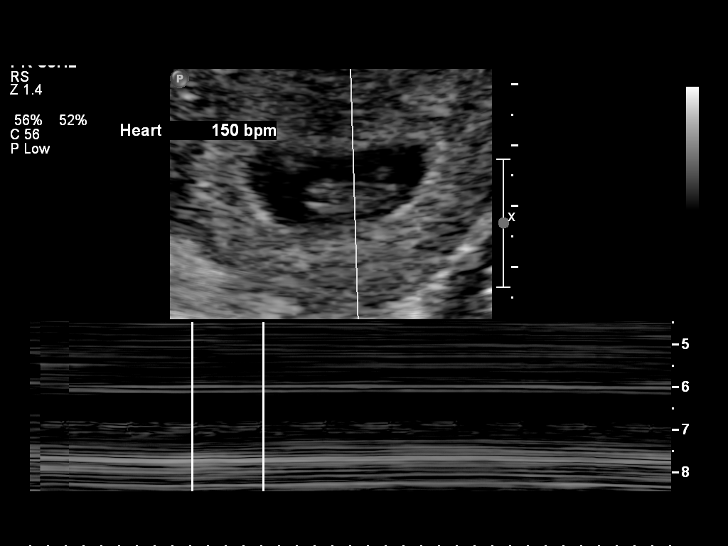
[im 23/23]
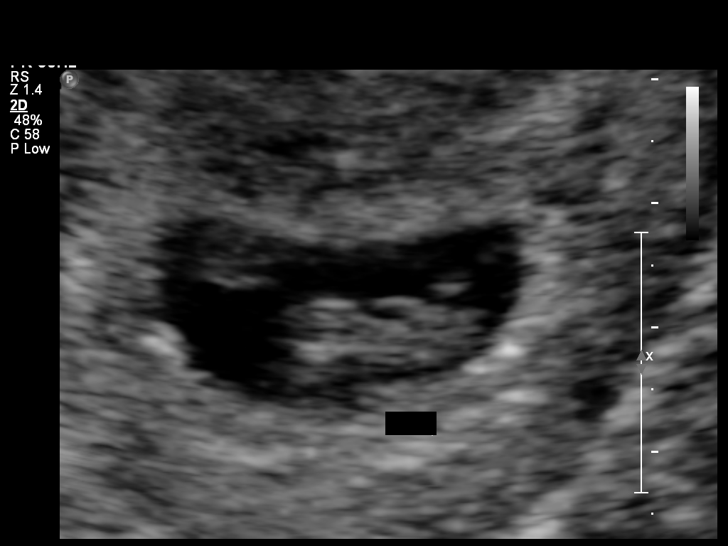

[14 of 23 positions shown; findings below may reference images not displayed]

FINDINGS: A single intrauterine gestational sac is identified containing a
yolk sac and embryo.
Fetal cardiac activity is identified measuring 150 beats per
minute.
Crown-rump length measures 1.77 cm corresponding to a gestational
age of 8 weeks 2 days.

There is no evidence of subchorionic hemorrhage.

The ovaries bilaterally are unremarkable.
There is no evidence of adnexal mass or free fluid.
IMPRESSION: Single living intrauterine gestation with estimated gestational age
of 8 weeks 2 days by this ultrasound.

## 2012-07-08 ENCOUNTER — Encounter (HOSPITAL_COMMUNITY): Payer: Self-pay | Admitting: *Deleted

## 2012-07-08 ENCOUNTER — Emergency Department (HOSPITAL_COMMUNITY)
Admission: EM | Admit: 2012-07-08 | Discharge: 2012-07-08 | Disposition: A | Discharge status: Home / Self Care | Payer: Medicaid Other | Attending: Emergency Medicine | Admitting: Emergency Medicine

## 2012-07-08 DIAGNOSIS — Y929 Unspecified place or not applicable: Secondary | ICD-10-CM | POA: Insufficient documentation

## 2012-07-08 DIAGNOSIS — Y939 Activity, unspecified: Secondary | ICD-10-CM | POA: Insufficient documentation

## 2012-07-08 DIAGNOSIS — S90569A Insect bite (nonvenomous), unspecified ankle, initial encounter: Secondary | ICD-10-CM | POA: Insufficient documentation

## 2012-07-08 DIAGNOSIS — W57XXXA Bitten or stung by nonvenomous insect and other nonvenomous arthropods, initial encounter: Secondary | ICD-10-CM

## 2012-07-08 DIAGNOSIS — L089 Local infection of the skin and subcutaneous tissue, unspecified: Secondary | ICD-10-CM

## 2012-07-08 MED ORDER — SULFAMETHOXAZOLE-TRIMETHOPRIM 800-160 MG PO TABS: 1.0000 | ORAL_TABLET | Freq: Two times a day (BID) | ORAL | Status: DC

## 2012-07-08 NOTE — ED Provider Notes (Signed)
History     CSN: [id number]  Arrival date & time 07/08/12  2228   First MD Initiated Contact with Patient 07/08/12 2247      No chief complaint on file.   (Consider location/radiation/quality/duration/timing/severity/associated sxs/prior treatment) HPI [name] is a 32 y.o. female who presents to the ED with infected insect bites. The areas started 2 days ago and have gotten red and swollen and painful. She denies any other problems. The history was provided by the patient.  History reviewed. No pertinent past medical history.  History reviewed. No pertinent past surgical history.  History reviewed. No pertinent family history.  History  Substance Use Topics  . Smoking status: Never Smoker   . Smokeless tobacco: Not on file  . Alcohol Use: No    OB History   Grav Para Term Preterm Abortions TAB SAB Ect Mult Living                  Review of Systems  Constitutional: Negative for fever and chills.  Gastrointestinal: Negative for nausea and vomiting.  Skin: Positive for wound.  Psychiatric/Behavioral: The patient is not nervous/anxious.     Allergies  Review of patient's allergies indicates no known allergies.  Home Medications  No current outpatient prescriptions on file.  BP 113/73  Pulse 76  Temp(Src) 99.2 F (37.3 C) (Oral)  Resp 18  Ht 5\' 4"  (1.626 m)  Wt 183 lb (83.008 kg)  BMI 31.4 kg/m2  SpO2 99%  LMP 06/18/2012  Physical Exam  Nursing note and vitals reviewed. Constitutional: She is oriented to person, place, and time. She appears well-developed and well-nourished. No distress.  HENT:  Head: Normocephalic.  Eyes: EOM are normal.  Neck: Neck supple.  Pulmonary/Chest: Effort normal.  Musculoskeletal:       Left ankle: She exhibits normal range of motion, no deformity and normal pulse. Swelling: minimal. Tenderness. Lateral malleolus tenderness found.       Feet:  Macular papular, erythematous areas x 2 lateral aspect of the left  ankle.    Neurological: She is alert and oriented to person, place, and time. No cranial nerve deficit.  Skin: Skin is warm and dry.  Psychiatric: She has a normal mood and affect.    ED Course  Procedures (including critical care time)   MDM  32 y.o. female with infected insect bites to the left ankle. Discussed findings and plan of care with the patient. She voices understanding. Will start antibiotics and she will take Benadryl as needed for itching. She will return if symptoms worsen.   Medication List    TAKE these medications       sulfamethoxazole-trimethoprim 800-160 MG per tablet  Commonly known as:  SEPTRA DS  Take 1 tablet by mouth every 12 (twelve) hours.               Janne Napoleon, [place] 07/10/12 [phone]

## 2012-07-08 NOTE — ED Notes (Signed)
2 round red dime size areas to lt lateral ankle, Thinks she was bitten by something 2 days ago , painful

## 2012-07-10 NOTE — ED Provider Notes (Signed)
Medical screening examination/treatment/procedure(s) were performed by non-physician practitioner and as supervising physician I was immediately available for consultation/collaboration.   Charles B. Bernette Mayers, MD 07/10/12 [phone]

## 2012-10-26 ENCOUNTER — Encounter (HOSPITAL_COMMUNITY): Payer: Self-pay | Admitting: *Deleted

## 2012-10-26 ENCOUNTER — Emergency Department (HOSPITAL_COMMUNITY)
Admission: EM | Admit: 2012-10-26 | Discharge: 2012-10-27 | Disposition: A | Discharge status: Home / Self Care | Payer: Medicaid Other | Attending: Emergency Medicine | Admitting: Emergency Medicine

## 2012-10-26 DIAGNOSIS — Z79899 Other long term (current) drug therapy: Secondary | ICD-10-CM | POA: Insufficient documentation

## 2012-10-26 DIAGNOSIS — R51 Headache: Secondary | ICD-10-CM | POA: Insufficient documentation

## 2012-10-26 DIAGNOSIS — R81 Glycosuria: Secondary | ICD-10-CM | POA: Insufficient documentation

## 2012-10-26 MED ORDER — DIPHENHYDRAMINE HCL 50 MG/ML IJ SOLN
25.0000 mg | Freq: Once | INTRAMUSCULAR | Status: AC
  Administered 2012-10-27: 25 mg via INTRAVENOUS
  Filled 2012-10-26: qty 1

## 2012-10-26 MED ORDER — SODIUM CHLORIDE 0.9 % IV BOLUS (SEPSIS)
1000.0000 mL | Freq: Once | INTRAVENOUS | Status: AC
  Administered 2012-10-27: 1000 mL via INTRAVENOUS

## 2012-10-26 NOTE — ED Notes (Signed)
Lights turned off for pt comfort

## 2012-10-26 NOTE — ED Notes (Signed)
Pt reports HA on & off for the past 3 weeks, states wanting to be checked to see if anything is wrong. Pt denies any head injury.

## 2012-10-26 NOTE — ED Notes (Signed)
Pt states has had headache for approximately 3 weeks off and on and has worsened today. Pt denies vision changes, denies vomiting and nausea today. NAD noted.

## 2012-10-27 LAB — URINALYSIS, ROUTINE W REFLEX MICROSCOPIC
Nitrite: NEGATIVE
Specific Gravity, Urine: 1.02 (ref 1.005–1.030)
Urobilinogen, UA: 1 mg/dL (ref 0.0–1.0)

## 2012-10-27 LAB — BASIC METABOLIC PANEL
BUN: 7 mg/dL (ref 6–23)
GFR calc Af Amer: >90 mL/min (ref >90)
GFR calc non Af Amer: >90 mL/min (ref >90)
Potassium: 3 mEq/L — ABNORMAL LOW (ref 3.5–5.1)
Sodium: 134 mEq/L — ABNORMAL LOW (ref 135–145)

## 2012-10-27 LAB — CBC WITH DIFFERENTIAL/PLATELET
Basophils Absolute: 0 10*3/uL (ref 0.0–0.1)
Basophils Relative: 0 % (ref 0–1)
Hemoglobin: 11.3 g/dL — ABNORMAL LOW (ref 12.0–15.0)
MCHC: 33.7 g/dL (ref 30.0–36.0)
Monocytes Relative: 7 % (ref 3–12)
Neutro Abs: 3.4 10*3/uL (ref 1.7–7.7)
Neutrophils Relative %: 52 % (ref 43–77)

## 2012-10-27 MED ORDER — POTASSIUM CHLORIDE CRYS ER 20 MEQ PO TBCR
40.0000 meq | EXTENDED_RELEASE_TABLET | Freq: Once | ORAL | Status: AC
  Administered 2012-10-27: 40 meq via ORAL
  Filled 2012-10-27: qty 2

## 2012-10-27 NOTE — ED Notes (Signed)
Patient given discharge instruction, verbalized understand. IV removed, band aid applied. Patient ambulatory out of the department.  

## 2012-10-29 NOTE — ED Provider Notes (Signed)
CSN: [id number]     Arrival date & time 10/26/12  2208 History   First MD Initiated Contact with Patient 10/26/12 2248     Chief Complaint  Patient presents with  . Headache   (Consider location/radiation/quality/duration/timing/severity/associated sxs/prior Treatment) HPI Comments: [name] is a 32 y.o. Female with a now 2 week history of nearly daily intermittent headaches which have been migratory, dull in character and sometimes relieved with otc excedrin migraine.  She denies recent history of chronic headache, and no history of migraines,  But has been diagnosed with tension headache in the past.  She is currently [redacted] weeks pregnant and will be establishing care with gyn soon, stating she wasn't certain she was keeping this baby until recently.  She denies nausea or vomiting, has had no fevers, neck pain or stiffness, denies focal weakness, dizziness or vision changes.       The history is provided by the patient.    History reviewed. No pertinent past medical history. History reviewed. No pertinent past surgical history. No family history on file. History  Substance Use Topics  . Smoking status: Never Smoker   . Smokeless tobacco: Not on file  . Alcohol Use: No   OB History   Grav Para Term Preterm Abortions TAB SAB Ect Mult Living   1              Review of Systems  Constitutional: Negative for fever and chills.  HENT: Negative for congestion, sore throat, neck pain, neck stiffness and sinus pressure.   Eyes: Negative.   Respiratory: Negative for chest tightness and shortness of breath.   Cardiovascular: Negative for chest pain.  Gastrointestinal: Negative for nausea and abdominal pain.  Genitourinary: Negative.   Musculoskeletal: Negative for joint swelling and arthralgias.  Skin: Negative.  Negative for rash and wound.  Neurological: Positive for headaches. Negative for dizziness, weakness, light-headedness and numbness.  Psychiatric/Behavioral: Negative.      Allergies  Review of patient's allergies indicates no known allergies.  Home Medications   Current Outpatient Rx  Name  Route  Sig  Dispense  Refill  . aspirin-acetaminophen-caffeine (EXCEDRIN MIGRAINE) 250-250-65 MG per tablet   Oral   Take 1 tablet by mouth every 6 (six) hours as needed for pain.         . Prenatal Vit-Fe Fumarate-FA (PRENATAL PO)   Oral   Take 1 tablet by mouth daily.          BP 114/69  Pulse 86  Temp(Src) 98.8 F (37.1 C) (Oral)  Resp 20  Ht 5\' 4"  (1.626 m)  Wt 188 lb 12.8 oz (85.639 kg)  BMI 32.39 kg/m2  SpO2 99%  LMP 07/15/2012 Physical Exam  Nursing note and vitals reviewed. Constitutional: She is oriented to person, place, and time. She appears well-developed and well-nourished.  fhr 140.  HENT:  Head: Normocephalic and atraumatic.  Mouth/Throat: Oropharynx is clear and moist.  Eyes: EOM are normal. Pupils are equal, round, and reactive to light.  Neck: Normal range of motion. Neck supple.  Cardiovascular: Normal rate and normal heart sounds.   Pulmonary/Chest: Effort normal.  Abdominal: Soft. There is no tenderness.  Musculoskeletal: Normal range of motion.  Lymphadenopathy:    She has no cervical adenopathy.  Neurological: She is alert and oriented to person, place, and time. She has normal strength. No sensory deficit. Gait normal. GCS eye subscore is 4. GCS verbal subscore is 5. GCS motor subscore is 6.  Normal heel-shin, normal rapid  alternating movements. Cranial nerves III-XII intact.  No pronator drift.  Skin: Skin is warm and dry. No rash noted.  Psychiatric: She has a normal mood and affect. Her speech is normal and behavior is normal. Thought content normal. Cognition and memory are normal.    ED Course  Procedures (including critical care time) Labs Review Labs Reviewed  URINALYSIS, ROUTINE W REFLEX MICROSCOPIC - Abnormal; Notable for the following:    Color, Urine AMBER (*)    Glucose, UA 250 (*)    Ketones, ur 15  (*)    All other components within normal limits  BASIC METABOLIC PANEL - Abnormal; Notable for the following:    Sodium 134 (*)    Potassium 3.0 (*)    Glucose, Bld 111 (*)    All other components within normal limits  CBC WITH DIFFERENTIAL - Abnormal; Notable for the following:    Hemoglobin 11.3 (*)    HCT 33.5 (*)    All other components within normal limits   Imaging Review No results found.  MDM   1. Headache   2. Glucosuria    Pt was given NS IV and also PO fluids,  Benadryl with relief of headache,  Stating she felt much better prior to dc.  Discussed lab results including glucosuria - she does report history of gestational DM.  She was given referral to Dr. Emelda Fear to establish ob care.  Also encouraged to stop taking excedrin, with pregnancy.      Burgess Amor, PA-C 10/29/12 2041   Medical screening examination/treatment/procedure(s) were conducted as a shared visit with non-physician practitioner(s) and myself.  I personally evaluated the patient during the encounter.  Patient feels better after IV fluids. No vaginal bleeding or discharge  Donnetta Hutching, MD 11/07/12 1944

## 2012-11-08 ENCOUNTER — Emergency Department (HOSPITAL_COMMUNITY)
Admission: EM | Admit: 2012-11-08 | Discharge: 2012-11-08 | Disposition: A | Discharge status: Home / Self Care | Payer: Medicaid Other | Attending: Emergency Medicine | Admitting: Emergency Medicine

## 2012-11-08 ENCOUNTER — Encounter (HOSPITAL_COMMUNITY): Payer: Self-pay

## 2012-11-08 DIAGNOSIS — Z3490 Encounter for supervision of normal pregnancy, unspecified, unspecified trimester: Secondary | ICD-10-CM

## 2012-11-08 DIAGNOSIS — O9989 Other specified diseases and conditions complicating pregnancy, childbirth and the puerperium: Secondary | ICD-10-CM | POA: Insufficient documentation

## 2012-11-08 DIAGNOSIS — R509 Fever, unspecified: Secondary | ICD-10-CM | POA: Insufficient documentation

## 2012-11-08 DIAGNOSIS — Z79899 Other long term (current) drug therapy: Secondary | ICD-10-CM | POA: Insufficient documentation

## 2012-11-08 DIAGNOSIS — K6289 Other specified diseases of anus and rectum: Secondary | ICD-10-CM | POA: Insufficient documentation

## 2012-11-08 DIAGNOSIS — K645 Perianal venous thrombosis: Secondary | ICD-10-CM

## 2012-11-08 DIAGNOSIS — M549 Dorsalgia, unspecified: Secondary | ICD-10-CM | POA: Insufficient documentation

## 2012-11-08 MED ORDER — HYDROCODONE-ACETAMINOPHEN 5-325 MG PO TABS: 1.0000 | ORAL_TABLET | Freq: Four times a day (QID) | ORAL | Status: DC | PRN

## 2012-11-08 NOTE — ED Notes (Signed)
Pt c/o rectal pain since last Thursday. Pt states she has hx of hemorrhoids. Pt denies blood in stool, abdominal pain, pelvic pain.

## 2012-11-08 NOTE — ED Notes (Signed)
Pt c/o rectal pain x 6days.  Pt says has history of hemmorhoids.  Pt says started having rectal pain last THursday.  Has been using preparation H, tucks pads, witch hazel but no relief.   Pt says pain is getting worse.  PT also has low grade fever.

## 2012-11-08 NOTE — ED Provider Notes (Signed)
CSN: [id number]     Arrival date & time 11/08/12  1516 History  This chart was scribed for Nicole Jakes, MD by Bennett Scrape, ED Scribe. This patient was seen in room APA16A/[facility]A16A and the patient's care was started at 5:40 PM.   Chief Complaint  Patient presents with  . Rectal Pain    Patient is a 32 y.o. female presenting with general illness. The history is provided by the patient. No language interpreter was used.  Illness Location:  Rectal pain Quality:  Sharp Severity:  Moderate Onset quality:  Gradual Duration:  6 days Timing:  Constant Progression:  Worsening Chronicity:  Recurrent Context:  H/o hemorrhoids Ineffective treatments:  Preparation H, tucks pads, witch hazel  Associated symptoms: fever (99.1 in the ED)   Associated symptoms: no abdominal pain, no chest pain, no congestion, no cough, no diarrhea, no headaches, no nausea, no rash, no shortness of breath, no sore throat and no vomiting    HPI Comments: [name] is a 32 y.o. female who is 4 months pregnant presents to the Emergency Department complaining of 6 days of gradually worsening, constant rectal pain. She reports prior episodes to her h/o hemorrhoids and admits to constipation as a preceding symptom. Last episode of severe hemorrhoid pain was one year ago. She has been trying Preparation H, Tucks pads and witch hazel with no improvement. She denies any modifying factors stating that she has not tried to have a BM since the onset of the pain. She denies nausea, emesis and abdominal pain.   Pt denies having a PCP currently She states that she is currently taking prenatal vitamins but has not been established with an OB-GYN yet.  History reviewed. No pertinent past medical history. History reviewed. No pertinent past surgical history. No family history on file. History  Substance Use Topics  . Smoking status: Never Smoker   . Smokeless tobacco: Not on file  . Alcohol Use: No   OB History    Grav Para Term Preterm Abortions TAB SAB Ect Mult Living   1              Review of Systems  Constitutional: Positive for fever (99.1 in the ED). Negative for chills.  HENT: Negative for congestion, sore throat and neck pain.   Eyes: Negative for visual disturbance.  Respiratory: Negative for cough and shortness of breath.   Cardiovascular: Negative for chest pain and leg swelling.  Gastrointestinal: Positive for rectal pain. Negative for nausea, vomiting, abdominal pain, diarrhea and anal bleeding.  Genitourinary: Negative for dysuria and vaginal bleeding.  Musculoskeletal: Positive for back pain (separate from current complaint).  Skin: Negative for rash.  Neurological: Negative for headaches.  Hematological: Does not bruise/bleed easily.  Psychiatric/Behavioral: Negative for confusion.    Allergies  Review of patient's allergies indicates no known allergies.  Home Medications   Current Outpatient Rx  Name  Route  Sig  Dispense  Refill  . acetaminophen (TYLENOL) 500 MG tablet   Oral   Take 1,000 mg by mouth every 6 (six) hours as needed for pain.         . Prenatal Vit-Fe Fumarate-FA (PRENATAL PO)   Oral   Take 1 tablet by mouth daily.         Marland Kitchen HYDROcodone-acetaminophen (NORCO/VICODIN) 5-325 MG per tablet   Oral   Take 1-2 tablets by mouth every 6 (six) hours as needed for pain.   14 tablet   0    Triage Vitals:  BP 106/83  Pulse 85  Temp(Src) 99.1 F (37.3 C) (Oral)  Resp 18  Ht 5\' 4"  (1.626 m)  Wt 180 lb (81.647 kg)  BMI 30.88 kg/m2  SpO2 100%  LMP 07/15/2012  Physical Exam  Nursing note and vitals reviewed. Constitutional: She is oriented to person, place, and time. She appears well-developed and well-nourished. No distress.  HENT:  Head: Normocephalic and atraumatic.  Mouth/Throat: Oropharynx is clear and moist.  Eyes: Conjunctivae and EOM are normal. Pupils are equal, round, and reactive to light.  Sclera are clear  Neck: Neck supple. No tracheal  deviation present.  Cardiovascular: Normal rate and regular rhythm.   No murmur heard. Pulses:      Dorsalis pedis pulses are 2+ on the right side, and 2+ on the left side.  Pulmonary/Chest: Effort normal and breath sounds normal. No respiratory distress. She has no wheezes.  Abdominal: Soft. Bowel sounds are normal. She exhibits no distension. There is no tenderness.  Genitourinary:  One large external hemorrhoid on the left side that is possible thrombosed, no active bleeding, internal rectal exam unable to be performed secondary to pain, chaperone present  Musculoskeletal: Normal range of motion. She exhibits no edema (no ankle swelling).  Lymphadenopathy:    She has no cervical adenopathy.  Neurological: She is alert and oriented to person, place, and time. No cranial nerve deficit.  Pt able to move both sets of fingers and toes  Skin: Skin is warm and dry. No rash noted.  Psychiatric: She has a normal mood and affect. Her behavior is normal.    ED Course  Procedures (including critical care time)  DIAGNOSTIC STUDIES: Oxygen Saturation is 100% on room air, normal by my interpretation.    COORDINATION OF CARE: 5:45 PM-Discussed treatment plan which includes rectal exam with pt at bedside and pt agreed to plan.   5:58 PM-Informed pt of rectal exam findings. Discussed discharge plan which includes pain medication, OTC creams and stool softeners with pt and pt agreed to plan. Also advised pt to follow up as needed and pt agreed. Addressed symptoms to return for with pt.   Labs Review Labs Reviewed - No data to display Imaging Review No results found.  MDM   1. External hemorrhoid, thrombosed   2. Pregnancy    Patient with large thrombosed hemorrhoid. Probably been present now for about 6 days will not I&D since been present for a while start to improve on its own. Patient given pain medication referral to OB/GYN recommendation for soaking in warm tub increasing fiber in  diet.  I personally performed the services described in this documentation, which was scribed in my presence. The recorded information has been reviewed and is accurate.     Nicole Jakes, MD 11/08/12 [phone]

## 2012-11-29 ENCOUNTER — Other Ambulatory Visit: Payer: Self-pay | Admitting: Obstetrics & Gynecology

## 2012-11-29 DIAGNOSIS — O0932 Supervision of pregnancy with insufficient antenatal care, second trimester: Secondary | ICD-10-CM

## 2012-12-01 ENCOUNTER — Other Ambulatory Visit: Payer: Self-pay | Admitting: Obstetrics & Gynecology

## 2012-12-01 ENCOUNTER — Encounter (INDEPENDENT_AMBULATORY_CARE_PROVIDER_SITE_OTHER): Payer: Self-pay

## 2012-12-01 ENCOUNTER — Ambulatory Visit (INDEPENDENT_AMBULATORY_CARE_PROVIDER_SITE_OTHER): Payer: Medicaid Other

## 2012-12-01 DIAGNOSIS — O0932 Supervision of pregnancy with insufficient antenatal care, second trimester: Secondary | ICD-10-CM

## 2012-12-01 DIAGNOSIS — O0930 Supervision of pregnancy with insufficient antenatal care, unspecified trimester: Secondary | ICD-10-CM

## 2012-12-01 DIAGNOSIS — O09299 Supervision of pregnancy with other poor reproductive or obstetric history, unspecified trimester: Secondary | ICD-10-CM

## 2012-12-01 NOTE — Progress Notes (Addendum)
U/S(19+6wks)-active fetus, meas c/w LMP dates, fluid wnl, post gr 0 plac, cx long and closed, bilateral adnexa wnl, female fetus, no major abnl noted although unable to view facial profile, NB and cardiac OFT's due to fetal positon. Would like to reck anatomy at 24-26 weeks

## 2012-12-07 ENCOUNTER — Encounter: Payer: Medicaid Other | Admitting: Women's Health

## 2012-12-19 ENCOUNTER — Ambulatory Visit (INDEPENDENT_AMBULATORY_CARE_PROVIDER_SITE_OTHER): Payer: Medicaid Other | Admitting: Women's Health

## 2012-12-19 ENCOUNTER — Encounter: Payer: Self-pay | Admitting: Women's Health

## 2012-12-19 ENCOUNTER — Other Ambulatory Visit (HOSPITAL_COMMUNITY)
Admission: RE | Admit: 2012-12-19 | Discharge: 2012-12-19 | Disposition: A | Discharge status: Home / Self Care | Payer: Medicaid Other | Source: Ambulatory Visit | Attending: Obstetrics & Gynecology | Admitting: Obstetrics & Gynecology

## 2012-12-19 ENCOUNTER — Other Ambulatory Visit: Payer: Self-pay | Admitting: Women's Health

## 2012-12-19 VITALS — BP 100/72 | Wt 188.0 lb

## 2012-12-19 DIAGNOSIS — O2390 Unspecified genitourinary tract infection in pregnancy, unspecified trimester: Secondary | ICD-10-CM

## 2012-12-19 DIAGNOSIS — Z113 Encounter for screening for infections with a predominantly sexual mode of transmission: Secondary | ICD-10-CM | POA: Insufficient documentation

## 2012-12-19 DIAGNOSIS — Z01419 Encounter for gynecological examination (general) (routine) without abnormal findings: Secondary | ICD-10-CM | POA: Insufficient documentation

## 2012-12-19 DIAGNOSIS — Z331 Pregnant state, incidental: Secondary | ICD-10-CM

## 2012-12-19 DIAGNOSIS — Z3482 Encounter for supervision of other normal pregnancy, second trimester: Secondary | ICD-10-CM

## 2012-12-19 DIAGNOSIS — Z8632 Personal history of gestational diabetes: Secondary | ICD-10-CM

## 2012-12-19 DIAGNOSIS — O09299 Supervision of pregnancy with other poor reproductive or obstetric history, unspecified trimester: Secondary | ICD-10-CM | POA: Insufficient documentation

## 2012-12-19 DIAGNOSIS — Z1151 Encounter for screening for human papillomavirus (HPV): Secondary | ICD-10-CM | POA: Insufficient documentation

## 2012-12-19 DIAGNOSIS — O0930 Supervision of pregnancy with insufficient antenatal care, unspecified trimester: Secondary | ICD-10-CM | POA: Insufficient documentation

## 2012-12-19 DIAGNOSIS — B373 Candidiasis of vulva and vagina: Secondary | ICD-10-CM

## 2012-12-19 DIAGNOSIS — O09292 Supervision of pregnancy with other poor reproductive or obstetric history, second trimester: Secondary | ICD-10-CM

## 2012-12-19 DIAGNOSIS — O0932 Supervision of pregnancy with insufficient antenatal care, second trimester: Secondary | ICD-10-CM

## 2012-12-19 DIAGNOSIS — N898 Other specified noninflammatory disorders of vagina: Secondary | ICD-10-CM

## 2012-12-19 DIAGNOSIS — Z131 Encounter for screening for diabetes mellitus: Secondary | ICD-10-CM

## 2012-12-19 DIAGNOSIS — Z1389 Encounter for screening for other disorder: Secondary | ICD-10-CM

## 2012-12-19 DIAGNOSIS — R81 Glycosuria: Secondary | ICD-10-CM

## 2012-12-19 DIAGNOSIS — O99019 Anemia complicating pregnancy, unspecified trimester: Secondary | ICD-10-CM

## 2012-12-19 DIAGNOSIS — Z3480 Encounter for supervision of other normal pregnancy, unspecified trimester: Secondary | ICD-10-CM | POA: Insufficient documentation

## 2012-12-19 LAB — POCT URINALYSIS DIPSTICK: Nitrite, UA: NEGATIVE

## 2012-12-19 LAB — POCT WET PREP (WET MOUNT): Clue Cells Wet Prep Whiff POC: NEGATIVE

## 2012-12-19 LAB — GLUCOSE, POCT (MANUAL RESULT ENTRY): POC Glucose: 88 mg/dl (ref 70–99)

## 2012-12-19 MED ORDER — FLUCONAZOLE 150 MG PO TABS: 150.0000 mg | ORAL_TABLET | Freq: Once | ORAL | Status: DC

## 2012-12-19 NOTE — Patient Instructions (Addendum)
You will have your sugar test next visit.  Please do not eat or drink anything after midnight the night before you come, not even water.  You will be here for at least two hours.    Pregnancy - Second Trimester The second trimester of pregnancy (3 to 6 months) is a period of rapid growth for you and your baby. At the end of the sixth month, your baby is about 9 inches long and weighs 1 1/2 pounds. You will begin to feel the baby move between 18 and 20 weeks of the pregnancy. This is called quickening. Weight gain is faster. A clear fluid (colostrum) may leak out of your breasts. You may feel small contractions of the womb (uterus). This is known as false labor or Braxton-Hicks contractions. This is like a practice for labor when the baby is ready to be born. Usually, the problems with morning sickness have usually passed by the end of your first trimester. Some women develop small dark blotches (called cholasma, mask of pregnancy) on their face that usually goes away after the baby is born. Exposure to the sun makes the blotches worse. Acne may also develop in some pregnant women and pregnant women who have acne, may find that it goes away. PRENATAL EXAMS  Blood work may continue to be done during prenatal exams. These tests are done to check on your health and the probable health of your baby. Blood work is used to follow your blood levels (hemoglobin). Anemia (low hemoglobin) is common during pregnancy. Iron and vitamins are given to help prevent this. You will also be checked for diabetes between 24 and 28 weeks of the pregnancy. Some of the previous blood tests may be repeated.  The size of the uterus is measured during each visit. This is to make sure that the baby is continuing to grow properly according to the dates of the pregnancy.  Your blood pressure is checked every prenatal visit. This is to make sure you are not getting toxemia.  Your urine is checked to make sure you do not have an  infection, diabetes or protein in the urine.  Your weight is checked often to make sure gains are happening at the suggested rate. This is to ensure that both you and your baby are growing normally.  Sometimes, an ultrasound is performed to confirm the proper growth and development of the baby. This is a test which bounces harmless sound waves off the baby so your caregiver can more accurately determine due dates. Sometimes, a test is done on the amniotic fluid surrounding the baby. This test is called an amniocentesis. The amniotic fluid is obtained by sticking a needle into the belly (abdomen). This is done to check the chromosomes in instances where there is a concern about possible genetic problems with the baby. It is also sometimes done near the end of pregnancy if an early delivery is required. In this case, it is done to help make sure the baby's lungs are mature enough for the baby to live outside of the womb. CHANGES OCCURING IN THE SECOND TRIMESTER OF PREGNANCY Your body goes through many changes during pregnancy. They vary from person to person. Talk to your caregiver about changes you notice that you are concerned about.  During the second trimester, you will likely have an increase in your appetite. It is normal to have cravings for certain foods. This varies from person to person and pregnancy to pregnancy.  Your lower abdomen will begin to   bulge.  You may have to urinate more often because the uterus and baby are pressing on your bladder. It is also common to get more bladder infections during pregnancy. You can help this by drinking lots of fluids and emptying your bladder before and after intercourse.  You may begin to get stretch marks on your hips, abdomen, and breasts. These are normal changes in the body during pregnancy. There are no exercises or medicines to take that prevent this change.  You may begin to develop swollen and bulging veins (varicose veins) in your legs.  Wearing support hose, elevating your feet for 15 minutes, 3 to 4 times a day and limiting salt in your diet helps lessen the problem.  Heartburn may develop as the uterus grows and pushes up against the stomach. Antacids recommended by your caregiver helps with this problem. Also, eating smaller meals 4 to 5 times a day helps.  Constipation can be treated with a stool softener or adding bulk to your diet. Drinking lots of fluids, and eating vegetables, fruits, and whole grains are helpful.  Exercising is also helpful. If you have been very active up until your pregnancy, most of these activities can be continued during your pregnancy. If you have been less active, it is helpful to start an exercise program such as walking.  Hemorrhoids may develop at the end of the second trimester. Warm sitz baths and hemorrhoid cream recommended by your caregiver helps hemorrhoid problems.  Backaches may develop during this time of your pregnancy. Avoid heavy lifting, wear low heal shoes, and practice good posture to help with backache problems.  Some pregnant women develop tingling and numbness of their hand and fingers because of swelling and tightening of ligaments in the wrist (carpel tunnel syndrome). This goes away after the baby is born.  As your breasts enlarge, you may have to get a bigger bra. Get a comfortable, cotton, support bra. Do not get a nursing bra until the last month of the pregnancy if you will be nursing the baby.  You may get a dark line from your belly button to the pubic area called the linea nigra.  You may develop rosy cheeks because of increase blood flow to the face.  You may develop spider looking lines of the face, neck, arms, and chest. These go away after the baby is born. HOME CARE INSTRUCTIONS   It is extremely important to avoid all smoking, herbs, alcohol, and unprescribed drugs during your pregnancy. These chemicals affect the formation and growth of the baby. Avoid  these chemicals throughout the pregnancy to ensure the delivery of a healthy infant.  Most of your home care instructions are the same as suggested for the first trimester of your pregnancy. Keep your caregiver's appointments. Follow your caregiver's instructions regarding medicine use, exercise, and diet.  During pregnancy, you are providing food for you and your baby. Continue to eat regular, well-balanced meals. Choose foods such as meat, fish, milk and other low fat dairy products, vegetables, fruits, and whole-grain breads and cereals. Your caregiver will tell you of the ideal weight gain.  A physical sexual relationship may be continued up until near the end of pregnancy if there are no other problems. Problems could include early (premature) leaking of amniotic fluid from the membranes, vaginal bleeding, abdominal pain, or other medical or pregnancy problems.  Exercise regularly if there are no restrictions. Check with your caregiver if you are unsure of the safety of some of your exercises. The   greatest weight gain will occur in the last 2 trimesters of pregnancy. Exercise will help you:  Control your weight.  Get you in shape for labor and delivery.  Lose weight after you have the baby.  Wear a good support or jogging bra for breast tenderness during pregnancy. This may help if worn during sleep. Pads or tissues may be used in the bra if you are leaking colostrum.  Do not use hot tubs, steam rooms or saunas throughout the pregnancy.  Wear your seat belt at all times when driving. This protects you and your baby if you are in an accident.  Avoid raw meat, uncooked cheese, cat litter boxes, and soil used by cats. These carry germs that can cause birth defects in the baby.  The second trimester is also a good time to visit your dentist for your dental health if this has not been done yet. Getting your teeth cleaned is okay. Use a soft toothbrush. Brush gently during pregnancy.  It is  easier to leak urine during pregnancy. Tightening up and strengthening the pelvic muscles will help with this problem. Practice stopping your urination while you are going to the bathroom. These are the same muscles you need to strengthen. It is also the muscles you would use as if you were trying to stop from passing gas. You can practice tightening these muscles up 10 times a set and repeating this about 3 times per day. Once you know what muscles to tighten up, do not perform these exercises during urination. It is more likely to contribute to an infection by backing up the urine.  Ask for help if you have financial, counseling, or nutritional needs during pregnancy. Your caregiver will be able to offer counseling for these needs as well as refer you for other special needs.  Your skin may become oily. If so, wash your face with mild soap, use non-greasy moisturizer and oil or cream based makeup. MEDICINES AND DRUG USE IN PREGNANCY  Take prenatal vitamins as directed. The vitamin should contain 1 milligram of folic acid. Keep all vitamins out of reach of children. Only a couple vitamins or tablets containing iron may be fatal to a baby or young child when ingested.  Avoid use of all medicines, including herbs, over-the-counter medicines, not prescribed or suggested by your caregiver. Only take over-the-counter or prescription medicines for pain, discomfort, or fever as directed by your caregiver. Do not use aspirin.  Let your caregiver also know about herbs you may be using.  Alcohol is related to a number of birth defects. This includes fetal alcohol syndrome. All alcohol, in any form, should be avoided completely. Smoking will cause low birth rate and premature babies.  Street or illegal drugs are very harmful to the baby. They are absolutely forbidden. A baby born to an addicted mother will be addicted at birth. The baby will go through the same withdrawal an adult does. SEEK MEDICAL CARE IF:    You have any concerns or worries during your pregnancy. It is better to call with your questions if you feel they cannot wait, rather than worry about them. SEEK IMMEDIATE MEDICAL CARE IF:   An unexplained oral temperature above 102 F (38.9 C) develops, or as your caregiver suggests.  You have leaking of fluid from the vagina (birth canal). If leaking membranes are suspected, take your temperature and tell your caregiver of this when you call.  There is vaginal spotting, bleeding, or passing clots. Tell your caregiver of   the amount and how many pads are used. Light spotting in pregnancy is common, especially following intercourse.  You develop a bad smelling vaginal discharge with a change in the color from clear to white.  You continue to feel sick to your stomach (nauseated) and have no relief from remedies suggested. You vomit blood or coffee ground-like materials.  You lose more than 2 pounds of weight or gain more than 2 pounds of weight over 1 week, or as suggested by your caregiver.  You notice swelling of your face, hands, feet, or legs.  You get exposed to German measles and have never had them.  You are exposed to fifth disease or chickenpox.  You develop belly (abdominal) pain. Round ligament discomfort is a common non-cancerous (benign) cause of abdominal pain in pregnancy. Your caregiver still must evaluate you.  You develop a bad headache that does not go away.  You develop fever, diarrhea, pain with urination, or shortness of breath.  You develop visual problems, blurry, or double vision.  You fall or are in a car accident or any kind of trauma.  There is mental or physical violence at home. Document Released: 02/03/2001 Document Revised: 11/04/2011 Document Reviewed: 08/08/2008 [employer] Patient Information 2014 [employer].  

## 2012-12-19 NOTE — Progress Notes (Signed)
Subjective:    [name] is a 32 y.o. [phone] African American female at [redacted]w[redacted]d by LMP which correlates well w/ 20wk u/s, being seen today for her first obstetrical visit.  Her obstetrical history is significant for term SVD x 2, had A1DM and pre-e w/ second .  This is an unplanned pregnancy although she was not using any contraception.  She initially wanted to have an EAB, but changed her mind. Pregnancy history fully reviewed.   Patient reports vaginal itching x few weeks, has used OTC Monistat w/o much relief.  Did have small amt spotting when wiping the other day, none since. Denies uti s/s, uc's, lof.  Vag d/c initially had an odor, but that subsided.   Filed Vitals:   12/19/12 1529  BP: 100/72  Weight: 188 lb (85.276 kg)    HISTORY: OB History  Gravida Para Term Preterm AB SAB TAB Ectopic Multiple Living  4 2 2  1  1   2     # Outcome Date GA Lbr Len/2nd Weight Sex Delivery Anes PTL Lv  4 CUR           3 TRM 09/14/09 [redacted]w[redacted]d  6 lb 6 oz (2.892 kg) F SVD EPI  Y  2 TAB 2007          1 TRM 01/31/02 [redacted]w[redacted]d  7 lb 3.4 oz (3.272 kg) F SVD EPI  Y     Past Medical History  Diagnosis Date  . Gestational diabetes    Past Surgical History  Procedure Laterality Date  . Oral sugery     Family History  Problem Relation Age of Onset  . Diabetes Mother   . Hypertension Mother   . Diabetes Father   . Cancer Father     started out as lung cancer then spread  . Hypertension Father   . Diabetes Sister   . Hypertension Sister   . Hyperlipidemia Sister   . Stroke Sister   . Diabetes Maternal Aunt   . Hypertension Maternal Aunt   . Diabetes Maternal Uncle   . Hypertension Maternal Uncle   . Cancer Maternal Grandmother   . Diabetes Maternal Grandfather   . Hypertension Maternal Grandfather   . Hyperlipidemia Maternal Grandfather   . Diabetes Maternal Uncle   . Hypertension Maternal Uncle   . Hypertension Maternal Uncle      Exam    Pelvic Exam:    Perineum: Normal  Perineum   Vulva: normal   Vagina:  normal mucosa, no palpable nodules, white clumpy non-odorous d/c   Uterus  Fundal Height: 23 cm     Cervix: normal   Adnexa: Not palpable   Urinary: urethral meatus normal    System:     Skin: normal coloration and turgor, no rashes    Neurologic: oriented, normal mood   Extremities: normal strength, tone, and muscle mass   HEENT PERRLA   Mouth/Teeth mucous membranes moist   Cardiovascular: regular rate and rhythm   Respiratory:  appears well, vitals normal, no respiratory distress, acyanotic, normal RR   Abdomen: soft, non-tender    Thin prep pap smear obtained w/ high risk HPV cotesting Wet prep+ yeast   Assessment:    Pregnancy: [id number] Patient Active Problem List   Diagnosis Date Noted  . H/O gestational diabetes in prior pregnancy, currently pregnant 12/19/2012    Priority: High  . Hx of preeclampsia, prior pregnancy, currently pregnant 12/19/2012    Priority: High  . Late prenatal care 12/19/2012  Priority: High  . Supervision of other normal pregnancy 12/19/2012    Priority: High      [redacted]w[redacted]d [id number] New OB visit Late to care H/O A1DM H/O pre-e    Plan:     Initial labs drawn including A1C Continue prenatal vitamins Problem list reviewed and updated Reviewed recommended weight gain based on pre-gravid BMI Encouraged well-balanced diet Genetic Screening discussed Quad Screen: requested Cystic fibrosis screening discussed requested Ultrasound discussed; fetal survey: obtained previously, but suboptimal views, so needs recheck at next visit Follow up in 4 weeks for anatomy u/s recheck, pn2, and visit Rx diflucan   Marge Duncans 12/19/2012 4:16 PM

## 2012-12-20 ENCOUNTER — Encounter: Payer: Self-pay | Admitting: Women's Health

## 2012-12-20 LAB — URINALYSIS
Hgb urine dipstick: NEGATIVE
Nitrite: NEGATIVE
Protein, ur: 30 mg/dL — AB
Urobilinogen, UA: 1 mg/dL (ref 0.0–1.0)

## 2012-12-20 LAB — AFP, QUAD SCREEN
Age Alone: 1:507 {titer}
Curr Gest Age: 22.3 wks.days
Down Syndrome Scr Risk Est: 1:2730 {titer}
INH: 185.4 pg/mL
MoM for INH: 0.96
MoM for hCG: 1.67
Osb Risk: 1:43000 {titer}
Tri 18 Scr Risk Est: NEGATIVE
Trisomy 18 (Edward) Syndrome Interp.: 1:40900 {titer}
uE3 Value: 2.3 ng/mL

## 2012-12-20 LAB — SICKLE CELL SCREEN: Sickle Cell Screen: NEGATIVE

## 2012-12-20 LAB — CBC
MCHC: 33 g/dL (ref 30.0–36.0)
Platelets: 288 10*3/uL (ref 150–400)
RDW: 15.2 % (ref 11.5–15.5)
WBC: 6.6 10*3/uL (ref 4.0–10.5)

## 2012-12-20 LAB — ANTIBODY SCREEN: Antibody Screen: NEGATIVE

## 2012-12-20 LAB — DRUG SCREEN, URINE, NO CONFIRMATION
Amphetamine Screen, Ur: NEGATIVE
Barbiturate Quant, Ur: NEGATIVE
Methadone: NEGATIVE
Opiate Screen, Urine: NEGATIVE

## 2012-12-20 LAB — RUBELLA SCREEN: Rubella: 0.97 Index — ABNORMAL HIGH (ref <0.90)

## 2012-12-20 LAB — OXYCODONE SCREEN, UA, RFLX CONFIRM: Oxycodone Screen, Ur: NEGATIVE ng/mL

## 2012-12-20 LAB — ABO AND RH: Rh Type: POSITIVE

## 2012-12-21 ENCOUNTER — Encounter: Payer: Self-pay | Admitting: Women's Health

## 2012-12-22 LAB — CYSTIC FIBROSIS DIAGNOSTIC STUDY

## 2012-12-24 ENCOUNTER — Encounter: Payer: Self-pay | Admitting: Women's Health

## 2013-01-12 ENCOUNTER — Telehealth: Payer: Self-pay | Admitting: *Deleted

## 2013-01-12 DIAGNOSIS — Z3482 Encounter for supervision of other normal pregnancy, second trimester: Secondary | ICD-10-CM

## 2013-01-12 DIAGNOSIS — O0932 Supervision of pregnancy with insufficient antenatal care, second trimester: Secondary | ICD-10-CM

## 2013-01-12 DIAGNOSIS — O09292 Supervision of pregnancy with other poor reproductive or obstetric history, second trimester: Secondary | ICD-10-CM

## 2013-01-12 NOTE — Telephone Encounter (Signed)
Pt wants to know if she can get another Rx for diflucan. Pt advised to make appointment to be seen for yeast infection.

## 2013-01-16 ENCOUNTER — Ambulatory Visit (INDEPENDENT_AMBULATORY_CARE_PROVIDER_SITE_OTHER): Payer: Medicaid Other | Admitting: Women's Health

## 2013-01-16 ENCOUNTER — Encounter: Payer: Medicaid Other | Admitting: Obstetrics & Gynecology

## 2013-01-16 ENCOUNTER — Encounter (INDEPENDENT_AMBULATORY_CARE_PROVIDER_SITE_OTHER): Payer: Self-pay

## 2013-01-16 ENCOUNTER — Other Ambulatory Visit: Payer: Medicaid Other

## 2013-01-16 ENCOUNTER — Encounter: Payer: Self-pay | Admitting: Women's Health

## 2013-01-16 VITALS — BP 100/70 | Wt 192.0 lb

## 2013-01-16 DIAGNOSIS — Z8632 Personal history of gestational diabetes: Secondary | ICD-10-CM

## 2013-01-16 DIAGNOSIS — O0930 Supervision of pregnancy with insufficient antenatal care, unspecified trimester: Secondary | ICD-10-CM

## 2013-01-16 DIAGNOSIS — O2390 Unspecified genitourinary tract infection in pregnancy, unspecified trimester: Secondary | ICD-10-CM

## 2013-01-16 DIAGNOSIS — N898 Other specified noninflammatory disorders of vagina: Secondary | ICD-10-CM

## 2013-01-16 DIAGNOSIS — Z1389 Encounter for screening for other disorder: Secondary | ICD-10-CM

## 2013-01-16 DIAGNOSIS — O09299 Supervision of pregnancy with other poor reproductive or obstetric history, unspecified trimester: Secondary | ICD-10-CM

## 2013-01-16 DIAGNOSIS — Z3482 Encounter for supervision of other normal pregnancy, second trimester: Secondary | ICD-10-CM

## 2013-01-16 DIAGNOSIS — B373 Candidiasis of vulva and vagina: Secondary | ICD-10-CM

## 2013-01-16 DIAGNOSIS — Z331 Pregnant state, incidental: Secondary | ICD-10-CM

## 2013-01-16 DIAGNOSIS — O99019 Anemia complicating pregnancy, unspecified trimester: Secondary | ICD-10-CM

## 2013-01-16 LAB — POCT URINALYSIS DIPSTICK
Glucose, UA: NEGATIVE
Nitrite, UA: NEGATIVE
Protein, UA: NEGATIVE

## 2013-01-16 LAB — POCT WET PREP (WET MOUNT): Clue Cells Wet Prep Whiff POC: NEGATIVE

## 2013-01-16 MED ORDER — FLUCONAZOLE 150 MG PO TABS: 150.0000 mg | ORAL_TABLET | Freq: Once | ORAL | Status: DC

## 2013-01-16 NOTE — Progress Notes (Signed)
Work-in. Reports good fm. Denies uc's, lof, vb, urinary frequency, urgency, hesitancy, or dysuria.  White cheesy d/c w/ vulvovaginal itching x 1wk. Diflucan worked great last time. Uses baby powder in genital area d/t sweating. Spec exam: mod amount thick white d/c adherent to vag walls w/ cottage cheese appearance, no odor. Wet prep + yeast. Rx diflucan. To eat yogurt w/ live cultures, try to do w/o baby powder to see if it makes a difference.   Reviewed ptl s/s, fm.  All questions answered. F/U as scheduled 12/2 for pn2, f/u anatomy u/s, and visit.

## 2013-01-16 NOTE — Patient Instructions (Signed)
Monilial Vaginitis  Vaginitis in a soreness, swelling and redness (inflammation) of the vagina and vulva. Monilial vaginitis is not a sexually transmitted infection.  CAUSES   Yeast vaginitis is caused by yeast (candida) that is normally found in your vagina. With a yeast infection, the candida has overgrown in number to a point that upsets the chemical balance.  SYMPTOMS   · White, thick vaginal discharge.  · Swelling, itching, redness and irritation of the vagina and possibly the lips of the vagina (vulva).  · Burning or painful urination.  · Painful intercourse.  DIAGNOSIS   Things that may contribute to monilial vaginitis are:  · Postmenopausal and virginal states.  · Pregnancy.  · Infections.  · Being tired, sick or stressed, especially if you had monilial vaginitis in the past.  · Diabetes. Good control will help lower the chance.  · Birth control pills.  · Tight fitting garments.  · Using bubble bath, feminine sprays, douches or deodorant tampons.  · Taking certain medications that kill germs (antibiotics).  · Sporadic recurrence can occur if you become ill.  TREATMENT   Your caregiver will give you medication.  · There are several kinds of anti monilial vaginal creams and suppositories specific for monilial vaginitis. For recurrent yeast infections, use a suppository or cream in the vagina 2 times a week, or as directed.  · Anti-monilial or steroid cream for the itching or irritation of the vulva may also be used. Get your caregiver's permission.  · Painting the vagina with methylene blue solution may help if the monilial cream does not work.  · Eating yogurt may help prevent monilial vaginitis.  HOME CARE INSTRUCTIONS   · Finish all medication as prescribed.  · Do not have sex until treatment is completed or after your caregiver tells you it is okay.  · Take warm sitz baths.  · Do not douche.  · Do not use tampons, especially scented ones.  · Wear cotton underwear.  · Avoid tight pants and panty  hose.  · Tell your sexual partner that you have a yeast infection. They should go to their caregiver if they have symptoms such as mild rash or itching.  · Your sexual partner should be treated as well if your infection is difficult to eliminate.  · Practice safer sex. Use condoms.  · Some vaginal medications cause latex condoms to fail. Vaginal medications that harm condoms are:  · Cleocin cream.  · Butoconazole (Femstat®).  · Terconazole (Terazol®) vaginal suppository.  · Miconazole (Monistat®) (may be purchased over the counter).  SEEK MEDICAL CARE IF:   · You have a temperature by mouth above 102° F (38.9° C).  · The infection is getting worse after 2 days of treatment.  · The infection is not getting better after 3 days of treatment.  · You develop blisters in or around your vagina.  · You develop vaginal bleeding, and it is not your menstrual period.  · You have pain when you urinate.  · You develop intestinal problems.  · You have pain with sexual intercourse.  Document Released: 11/19/2004 Document Revised: 05/04/2011 Document Reviewed: 08/03/2008  ExitCare® Patient Information ©2014 [employer].

## 2013-01-17 ENCOUNTER — Encounter: Payer: Medicaid Other | Admitting: Women's Health

## 2013-01-17 ENCOUNTER — Other Ambulatory Visit: Payer: Medicaid Other

## 2013-01-24 ENCOUNTER — Ambulatory Visit (INDEPENDENT_AMBULATORY_CARE_PROVIDER_SITE_OTHER): Payer: Medicaid Other

## 2013-01-24 ENCOUNTER — Ambulatory Visit (INDEPENDENT_AMBULATORY_CARE_PROVIDER_SITE_OTHER): Payer: Medicaid Other | Admitting: Advanced Practice Midwife

## 2013-01-24 ENCOUNTER — Other Ambulatory Visit: Payer: Medicaid Other

## 2013-01-24 ENCOUNTER — Other Ambulatory Visit: Payer: Self-pay | Admitting: Women's Health

## 2013-01-24 VITALS — BP 104/70 | Wt 190.0 lb

## 2013-01-24 DIAGNOSIS — O09299 Supervision of pregnancy with other poor reproductive or obstetric history, unspecified trimester: Secondary | ICD-10-CM

## 2013-01-24 DIAGNOSIS — O358XX0 Maternal care for other (suspected) fetal abnormality and damage, not applicable or unspecified: Secondary | ICD-10-CM

## 2013-01-24 DIAGNOSIS — Z8632 Personal history of gestational diabetes: Secondary | ICD-10-CM

## 2013-01-24 DIAGNOSIS — Z331 Pregnant state, incidental: Secondary | ICD-10-CM

## 2013-01-24 DIAGNOSIS — O99019 Anemia complicating pregnancy, unspecified trimester: Secondary | ICD-10-CM

## 2013-01-24 DIAGNOSIS — Z1389 Encounter for screening for other disorder: Secondary | ICD-10-CM

## 2013-01-24 DIAGNOSIS — O09292 Supervision of pregnancy with other poor reproductive or obstetric history, second trimester: Secondary | ICD-10-CM

## 2013-01-24 DIAGNOSIS — O0930 Supervision of pregnancy with insufficient antenatal care, unspecified trimester: Secondary | ICD-10-CM

## 2013-01-24 DIAGNOSIS — Z3482 Encounter for supervision of other normal pregnancy, second trimester: Secondary | ICD-10-CM

## 2013-01-24 LAB — POCT URINALYSIS DIPSTICK
Ketones, UA: 40
Nitrite, UA: NEGATIVE

## 2013-01-24 LAB — CBC
HCT: 33.6 % — ABNORMAL LOW (ref 36.0–46.0)
MCV: 84.4 fL (ref 78.0–100.0)
Platelets: 254 10*3/uL (ref 150–400)
RBC: 3.98 MIL/uL (ref 3.87–5.11)
WBC: 4.6 10*3/uL (ref 4.0–10.5)

## 2013-01-24 NOTE — Progress Notes (Signed)
U/S(27+4wks)-vtx active fetus, appropr growth EFW 2 lb 8 oz (55th%tile), fluid wnl, posterior Gr 1 placenta, female fetus, anatomy reviewed no major abnl noted, FHR-132 bpm

## 2013-01-24 NOTE — Progress Notes (Signed)
Did PN2 today.    No c/o at this time.  Routine questions about pregnancy answered.  F/U in 4 weeks for LROB.

## 2013-01-25 DIAGNOSIS — R768 Other specified abnormal immunological findings in serum: Secondary | ICD-10-CM | POA: Insufficient documentation

## 2013-01-25 LAB — RPR

## 2013-01-25 LAB — GLUCOSE TOLERANCE, 2 HOURS W/ 1HR
Glucose, 1 hour: 142 mg/dL (ref 70–170)
Glucose, Fasting: 85 mg/dL (ref 70–99)

## 2013-01-25 LAB — ANTIBODY SCREEN: Antibody Screen: NEGATIVE

## 2013-01-25 LAB — HSV 2 ANTIBODY, IGG: HSV 2 Glycoprotein G Ab, IgG: 4.06 IV — ABNORMAL HIGH

## 2013-02-18 ENCOUNTER — Encounter (HOSPITAL_COMMUNITY): Payer: Self-pay

## 2013-02-18 ENCOUNTER — Inpatient Hospital Stay (HOSPITAL_COMMUNITY)
Admission: AD | Admit: 2013-02-18 | Discharge: 2013-02-18 | Disposition: A | Discharge status: Home / Self Care | Payer: Medicaid Other | Source: Ambulatory Visit | Attending: Obstetrics & Gynecology | Admitting: Obstetrics & Gynecology

## 2013-02-18 DIAGNOSIS — O09292 Supervision of pregnancy with other poor reproductive or obstetric history, second trimester: Secondary | ICD-10-CM

## 2013-02-18 DIAGNOSIS — R109 Unspecified abdominal pain: Secondary | ICD-10-CM | POA: Insufficient documentation

## 2013-02-18 DIAGNOSIS — O99891 Other specified diseases and conditions complicating pregnancy: Secondary | ICD-10-CM | POA: Insufficient documentation

## 2013-02-18 DIAGNOSIS — E860 Dehydration: Secondary | ICD-10-CM

## 2013-02-18 DIAGNOSIS — R6889 Other general symptoms and signs: Secondary | ICD-10-CM

## 2013-02-18 DIAGNOSIS — O212 Late vomiting of pregnancy: Secondary | ICD-10-CM | POA: Insufficient documentation

## 2013-02-18 DIAGNOSIS — R51 Headache: Secondary | ICD-10-CM | POA: Insufficient documentation

## 2013-02-18 DIAGNOSIS — R52 Pain, unspecified: Secondary | ICD-10-CM | POA: Insufficient documentation

## 2013-02-18 LAB — COMPREHENSIVE METABOLIC PANEL
AST: 18 U/L (ref 0–37)
Albumin: 2.9 g/dL — ABNORMAL LOW (ref 3.5–5.2)
BUN: 7 mg/dL (ref 6–23)
CO2: 21 mEq/L (ref 19–32)
Chloride: 101 mEq/L (ref 96–112)
Creatinine, Ser: 0.48 mg/dL — ABNORMAL LOW (ref 0.50–1.10)
GFR calc non Af Amer: >90 mL/min (ref >90)
Total Bilirubin: 0.7 mg/dL (ref 0.3–1.2)
Total Protein: 6.5 g/dL (ref 6.0–8.3)

## 2013-02-18 LAB — URINALYSIS, ROUTINE W REFLEX MICROSCOPIC
Bilirubin Urine: NEGATIVE
Glucose, UA: NEGATIVE mg/dL
Hgb urine dipstick: NEGATIVE
Ketones, ur: >80 mg/dL — AB
Specific Gravity, Urine: >1.03 — ABNORMAL HIGH (ref 1.005–1.030)
Urobilinogen, UA: 1 mg/dL (ref 0.0–1.0)

## 2013-02-18 LAB — URINE MICROSCOPIC-ADD ON

## 2013-02-18 LAB — CBC
HCT: 33.3 % — ABNORMAL LOW (ref 36.0–46.0)
MCHC: 33.3 g/dL (ref 30.0–36.0)
MCV: 86 fL (ref 78.0–100.0)
Platelets: 187 10*3/uL (ref 150–400)
RDW: 13.3 % (ref 11.5–15.5)
WBC: 7.9 10*3/uL (ref 4.0–10.5)

## 2013-02-18 LAB — INFLUENZA PANEL BY PCR (TYPE A & B)
Influenza A By PCR: POSITIVE — AB
Influenza B By PCR: NEGATIVE

## 2013-02-18 MED ORDER — BUTALBITAL-APAP-CAFFEINE 50-325-40 MG PO TABS
1.0000 | ORAL_TABLET | Freq: Once | ORAL | Status: AC
  Administered 2013-02-18: 1 via ORAL
  Filled 2013-02-18: qty 1

## 2013-02-18 MED ORDER — OSELTAMIVIR PHOSPHATE 75 MG PO CAPS
75.0000 mg | ORAL_CAPSULE | Freq: Once | ORAL | Status: AC
  Administered 2013-02-18: 75 mg via ORAL
  Filled 2013-02-18: qty 1

## 2013-02-18 MED ORDER — ONDANSETRON HCL 4 MG/2ML IJ SOLN
4.0000 mg | Freq: Once | INTRAMUSCULAR | Status: AC
  Administered 2013-02-18: 4 mg via INTRAVENOUS
  Filled 2013-02-18: qty 2

## 2013-02-18 MED ORDER — ONDANSETRON 4 MG PO TBDP: 4.0000 mg | ORAL_TABLET | Freq: Three times a day (TID) | ORAL | Status: DC | PRN

## 2013-02-18 MED ORDER — LACTATED RINGERS IV BOLUS (SEPSIS)
1000.0000 mL | Freq: Once | INTRAVENOUS | Status: AC
  Administered 2013-02-18: 1000 mL via INTRAVENOUS

## 2013-02-18 MED ORDER — OSELTAMIVIR PHOSPHATE 75 MG PO CAPS: 75.0000 mg | ORAL_CAPSULE | Freq: Two times a day (BID) | ORAL | Status: DC

## 2013-02-18 NOTE — Progress Notes (Signed)
Written and verbal d/c instructions given and understanding voiced. 

## 2013-02-18 NOTE — MAU Provider Note (Addendum)
History     CSN: [id number]  Arrival date and time: 02/18/13 0046   None     Chief Complaint  Patient presents with  . Emesis During Pregnancy  . Headache  . Generalized Body Aches   HPI Patient is a 32 yo G4P2012 at 31.1 weeks here with vomiting x3 episodes streaked with blood that started this morning.  Lacks appetite and has decreased PO intake.  Emesis is stomach contents.  Admits to abdominal pain, body aches, muscle aches, chills, headache.  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  OB History   Grav Para Term Preterm Abortions TAB SAB Ect Mult Living   4 2 2  1 1    2       Past Medical History  Diagnosis Date  . Gestational diabetes   . Seropositive for herpes simplex 2 infection     suppess at 34 weeks    Past Surgical History  Procedure Laterality Date  . Oral sugery      Family History  Problem Relation Age of Onset  . Diabetes Mother   . Hypertension Mother   . Diabetes Father   . Cancer Father     started out as lung cancer then spread  . Hypertension Father   . Diabetes Sister   . Hypertension Sister   . Hyperlipidemia Sister   . Stroke Sister   . Diabetes Maternal Aunt   . Hypertension Maternal Aunt   . Diabetes Maternal Uncle   . Hypertension Maternal Uncle   . Cancer Maternal Grandmother   . Diabetes Maternal Grandfather   . Hypertension Maternal Grandfather   . Hyperlipidemia Maternal Grandfather   . Diabetes Maternal Uncle   . Hypertension Maternal Uncle   . Hypertension Maternal Uncle     History  Substance Use Topics  . Smoking status: Never Smoker   . Smokeless tobacco: Never Used  . Alcohol Use: No    Allergies: No Known Allergies  Prescriptions prior to admission  Medication Sig Dispense Refill  . acetaminophen (TYLENOL) 500 MG tablet Take 1,000 mg by mouth every 6 (six) hours as needed for pain.      Marland Kitchen acetaminophen-codeine (TYLENOL #3) 300-30 MG per tablet Take 1 tablet by  mouth every 4 (four) hours as needed for pain.      . Prenatal Vit-Fe Fumarate-FA (PRENATAL PO) Take 1 tablet by mouth daily.      . fluconazole (DIFLUCAN) 150 MG tablet Take 1 tablet (150 mg total) by mouth once. Take 1 pill now, may take 2nd pill in 3 days if needed  2 tablet  0  . HYDROcodone-acetaminophen (NORCO/VICODIN) 5-325 MG per tablet Take 1-2 tablets by mouth every 6 (six) hours as needed for pain.  14 tablet  0    Review of Systems  Constitutional: Positive for fever and chills.  Respiratory: Negative for cough, shortness of breath and wheezing.   Cardiovascular: Negative for chest pain and orthopnea.  Gastrointestinal: Positive for nausea, vomiting and abdominal pain. Negative for diarrhea and constipation.  Genitourinary: Negative for dysuria, urgency, frequency and flank pain.  Neurological: Negative for dizziness, tingling, tremors and sensory change.   Physical Exam   Blood pressure 105/56, pulse 99, temperature 99.7 F (37.6 C), resp. rate 20, height 5\' 4"  (1.626 m), weight 84.732 kg (186 lb 12.8 oz), last menstrual period 07/15/2012.  Physical Exam  Constitutional: She is oriented to person, place, and time. She appears well-developed and  well-nourished.  HENT:  Head: Normocephalic and atraumatic.  Respiratory: Effort normal and breath sounds normal.  GI: Soft. Bowel sounds are normal. She exhibits no distension and no mass. There is no tenderness. There is no rebound and no guarding.  Musculoskeletal: Normal range of motion. She exhibits no edema and no tenderness.  Neurological: She is alert and oriented to person, place, and time.  Skin: Skin is warm and dry.  Psychiatric: She has a normal mood and affect. Her behavior is normal. Judgment and thought content normal.   Lab Results  Component Value Date   WBC 7.9 02/18/2013   HGB 11.1* 02/18/2013   HCT 33.3* 02/18/2013   MCV 86.0 02/18/2013   PLT 187 02/18/2013   Lab Results  Component Value Date   NA 136  02/18/2013   K 3.2* 02/18/2013   CL 101 02/18/2013   CO2 21 02/18/2013   Lab Results  Component Value Date   ALT 9 02/18/2013   AST 18 02/18/2013   ALKPHOS 91 02/18/2013   BILITOT 0.7 02/18/2013     MAU Course  Procedures NST: Category 1 tracing.  No decels, + accel.  No contractions  MDM Influenza PCR pending.  Zofran and IVF given for mild dehydration.  No LFT elevations.  Possible flu or gastroenteritis.  Patient improved with Zofran and IVF.  Assessment and Plan  1.  Flu-like illness 2.  IUP  3  Mild dehydration   Flu swab pending.  Will treat with zofran and tamiflu for suspected flu.  Continue with rest, fluids, light fluids.  Pearlina Friedly JEHIEL 02/18/2013, 2:34 AM

## 2013-02-18 NOTE — MAU Note (Signed)
Started off yest with dry cough. THis am got up and could hardly breathe. Went on to work and started having chills, general body aches. Went home and took nap. Woke up and had to vomit. Have thrown up 3 times and each time has blood in it. Having some contractions

## 2013-02-18 NOTE — MAU Note (Signed)
Dry cough then got worse, head hurting and felt cold.  Vomited and it had blood in it. C/o stomach and back ache and head throbbing.  No vaginal bleeding and no leaking.  Baby moving well but not as much as normal per pt.

## 2013-02-21 ENCOUNTER — Ambulatory Visit (INDEPENDENT_AMBULATORY_CARE_PROVIDER_SITE_OTHER): Payer: Medicaid Other | Admitting: Obstetrics and Gynecology

## 2013-02-21 ENCOUNTER — Encounter: Payer: Self-pay | Admitting: Obstetrics and Gynecology

## 2013-02-21 ENCOUNTER — Encounter (INDEPENDENT_AMBULATORY_CARE_PROVIDER_SITE_OTHER): Payer: Self-pay

## 2013-02-21 VITALS — BP 90/42 | Temp 98.2°F | Wt 186.2 lb

## 2013-02-21 DIAGNOSIS — Z331 Pregnant state, incidental: Secondary | ICD-10-CM

## 2013-02-21 DIAGNOSIS — Z8632 Personal history of gestational diabetes: Secondary | ICD-10-CM

## 2013-02-21 DIAGNOSIS — Z1389 Encounter for screening for other disorder: Secondary | ICD-10-CM

## 2013-02-21 DIAGNOSIS — O98519 Other viral diseases complicating pregnancy, unspecified trimester: Secondary | ICD-10-CM

## 2013-02-21 DIAGNOSIS — O99019 Anemia complicating pregnancy, unspecified trimester: Secondary | ICD-10-CM

## 2013-02-21 DIAGNOSIS — J111 Influenza due to unidentified influenza virus with other respiratory manifestations: Secondary | ICD-10-CM

## 2013-02-21 DIAGNOSIS — O0930 Supervision of pregnancy with insufficient antenatal care, unspecified trimester: Secondary | ICD-10-CM

## 2013-02-21 DIAGNOSIS — O09299 Supervision of pregnancy with other poor reproductive or obstetric history, unspecified trimester: Secondary | ICD-10-CM

## 2013-02-21 LAB — POCT URINALYSIS DIPSTICK
Glucose, UA: NEGATIVE
Ketones, UA: NEGATIVE

## 2013-02-21 NOTE — Progress Notes (Signed)
Tx fro suspected Flu 12/27, still weak, malaise, no resp sx at this time Tested ++ for influenza A. Will keep out of work x 5 days. Return to work Monday 02/28/12. Will give note.

## 2013-02-21 NOTE — Patient Instructions (Signed)

## 2013-02-23 NOTE — L&D Delivery Note (Signed)
Delivery Note At 1:00 PM a viable and healthy female was delivered via Vaginal, Spontaneous Delivery (Presentation:OA with nuchal hand ;  ).  APGAR: 9, 9; weight .   Placenta status: Intact, Spontaneous.  Cord: 3 vessels with the following complications: None.    Anesthesia: Epidural  Episiotomy: None Lacerations: None Suture Repair: n/a Est. Blood Loss (mL): 200  Mom to postpartum.  Baby to Couplet care / Skin to Skin  Breastfeeding Circumcision outside hospital Nexplanon planned.  Wynelle BourgeoisWILLIAMS,Katrina Brosh 04/12/2013, 1:20 PM

## 2013-03-07 ENCOUNTER — Ambulatory Visit (INDEPENDENT_AMBULATORY_CARE_PROVIDER_SITE_OTHER): Payer: Medicaid Other | Admitting: Obstetrics & Gynecology

## 2013-03-07 ENCOUNTER — Encounter: Payer: Self-pay | Admitting: Obstetrics & Gynecology

## 2013-03-07 VITALS — BP 100/60 | Wt 186.0 lb

## 2013-03-07 DIAGNOSIS — Z1389 Encounter for screening for other disorder: Secondary | ICD-10-CM

## 2013-03-07 DIAGNOSIS — O0930 Supervision of pregnancy with insufficient antenatal care, unspecified trimester: Secondary | ICD-10-CM

## 2013-03-07 DIAGNOSIS — Z331 Pregnant state, incidental: Secondary | ICD-10-CM

## 2013-03-07 DIAGNOSIS — O99019 Anemia complicating pregnancy, unspecified trimester: Secondary | ICD-10-CM

## 2013-03-07 DIAGNOSIS — O09299 Supervision of pregnancy with other poor reproductive or obstetric history, unspecified trimester: Secondary | ICD-10-CM

## 2013-03-07 DIAGNOSIS — O98519 Other viral diseases complicating pregnancy, unspecified trimester: Secondary | ICD-10-CM

## 2013-03-07 DIAGNOSIS — Z8632 Personal history of gestational diabetes: Secondary | ICD-10-CM

## 2013-03-07 LAB — POCT URINALYSIS DIPSTICK
Blood, UA: NEGATIVE
Glucose, UA: NEGATIVE
Ketones, UA: 3
Leukocytes, UA: NEGATIVE
Nitrite, UA: NEGATIVE
PROTEIN UA: 1

## 2013-03-07 MED ORDER — ACYCLOVIR 400 MG PO TABS: 400.0000 mg | ORAL_TABLET | Freq: Three times a day (TID) | ORAL | Status: DC

## 2013-03-07 NOTE — Progress Notes (Signed)
BP weight and urine results all reviewed and noted. Patient reports good fetal movement, denies any bleeding and no rupture of membranes symptoms or regular contractions. Patient is without complaints. All questions were answered.  

## 2013-03-16 ENCOUNTER — Telehealth: Payer: Self-pay | Admitting: *Deleted

## 2013-03-16 NOTE — Telephone Encounter (Signed)
Pt asking when she can begin maternity leave, c/o some cramping through out the day, no vaginal bleeding, +FM. Pt seen 03/07/2012 next appt next Tuesday. Pt encouraged to push fluids, take tylenol, rest, if no improvement or decrease FM, vaginal bleeding or gush of fluids, contractions 5-10 minutes apart [facility] Pt verbalized understanding.

## 2013-03-21 ENCOUNTER — Ambulatory Visit (INDEPENDENT_AMBULATORY_CARE_PROVIDER_SITE_OTHER): Payer: Medicaid Other | Admitting: Obstetrics & Gynecology

## 2013-03-21 ENCOUNTER — Encounter: Payer: Self-pay | Admitting: Obstetrics & Gynecology

## 2013-03-21 ENCOUNTER — Encounter (INDEPENDENT_AMBULATORY_CARE_PROVIDER_SITE_OTHER): Payer: Self-pay

## 2013-03-21 VITALS — BP 100/70 | Wt 188.0 lb

## 2013-03-21 DIAGNOSIS — Z1389 Encounter for screening for other disorder: Secondary | ICD-10-CM

## 2013-03-21 DIAGNOSIS — Z8632 Personal history of gestational diabetes: Secondary | ICD-10-CM

## 2013-03-21 DIAGNOSIS — Z3480 Encounter for supervision of other normal pregnancy, unspecified trimester: Secondary | ICD-10-CM

## 2013-03-21 DIAGNOSIS — O99019 Anemia complicating pregnancy, unspecified trimester: Secondary | ICD-10-CM

## 2013-03-21 DIAGNOSIS — O0930 Supervision of pregnancy with insufficient antenatal care, unspecified trimester: Secondary | ICD-10-CM

## 2013-03-21 DIAGNOSIS — Z331 Pregnant state, incidental: Secondary | ICD-10-CM

## 2013-03-21 DIAGNOSIS — O09299 Supervision of pregnancy with other poor reproductive or obstetric history, unspecified trimester: Secondary | ICD-10-CM

## 2013-03-21 DIAGNOSIS — O98519 Other viral diseases complicating pregnancy, unspecified trimester: Secondary | ICD-10-CM

## 2013-03-21 LAB — POCT URINALYSIS DIPSTICK
Blood, UA: NEGATIVE
Glucose, UA: NEGATIVE
Ketones, UA: 3
Nitrite, UA: NEGATIVE
PROTEIN UA: 1

## 2013-03-21 MED ORDER — LIDOCAINE HCL 2 % EX GEL: 1.0000 "application " | CUTANEOUS | Status: DC | PRN

## 2013-03-21 MED ORDER — HYDROCORTISONE ACE-PRAMOXINE 1-1 % RE FOAM: 1.0000 | Freq: Two times a day (BID) | RECTAL | Status: DC

## 2013-03-21 NOTE — Progress Notes (Signed)
Pt with complaints of her hemorrhoids but she won't let me see them, history could be thrombosed but will treat with 2%lidocaine jelly and topical steroids  BP weight and urine results all reviewed and noted. Patient reports good fetal movement, denies any bleeding and no rupture of membranes symptoms or regular contractions. Patient is without complaints. All questions were answered.

## 2013-03-21 NOTE — Addendum Note (Signed)
Addended by: Criss AlvinePULLIAM, CHRYSTAL G on: 03/21/2013 12:07 PM   Modules accepted: Orders

## 2013-03-28 ENCOUNTER — Ambulatory Visit (INDEPENDENT_AMBULATORY_CARE_PROVIDER_SITE_OTHER): Payer: Medicaid Other | Admitting: Obstetrics & Gynecology

## 2013-03-28 ENCOUNTER — Encounter: Payer: Self-pay | Admitting: Obstetrics & Gynecology

## 2013-03-28 VITALS — BP 100/70 | Wt 189.0 lb

## 2013-03-28 DIAGNOSIS — Z3480 Encounter for supervision of other normal pregnancy, unspecified trimester: Secondary | ICD-10-CM

## 2013-03-28 DIAGNOSIS — O09299 Supervision of pregnancy with other poor reproductive or obstetric history, unspecified trimester: Secondary | ICD-10-CM

## 2013-03-28 DIAGNOSIS — O0930 Supervision of pregnancy with insufficient antenatal care, unspecified trimester: Secondary | ICD-10-CM

## 2013-03-28 DIAGNOSIS — Z1389 Encounter for screening for other disorder: Secondary | ICD-10-CM

## 2013-03-28 DIAGNOSIS — Z8632 Personal history of gestational diabetes: Secondary | ICD-10-CM

## 2013-03-28 DIAGNOSIS — O99019 Anemia complicating pregnancy, unspecified trimester: Secondary | ICD-10-CM

## 2013-03-28 DIAGNOSIS — O98519 Other viral diseases complicating pregnancy, unspecified trimester: Secondary | ICD-10-CM

## 2013-03-28 DIAGNOSIS — Z331 Pregnant state, incidental: Secondary | ICD-10-CM

## 2013-03-28 LAB — POCT URINALYSIS DIPSTICK
Blood, UA: NEGATIVE
Glucose, UA: NEGATIVE
KETONES UA: NEGATIVE
NITRITE UA: NEGATIVE
PROTEIN UA: 1

## 2013-03-28 LAB — OB RESULTS CONSOLE GC/CHLAMYDIA
Chlamydia: NEGATIVE
Gonorrhea: NEGATIVE

## 2013-03-28 NOTE — Progress Notes (Signed)
BP weight and urine results all reviewed and noted. Patient reports good fetal movement, denies any bleeding and no rupture of membranes symptoms or regular contractions. Patient is without complaints. All questions were answered. Hemorrhoids not thrombosed

## 2013-03-29 LAB — GC/CHLAMYDIA PROBE AMP
CT Probe RNA: NEGATIVE
GC PROBE AMP APTIMA: NEGATIVE

## 2013-03-30 ENCOUNTER — Encounter: Payer: Self-pay | Admitting: Obstetrics & Gynecology

## 2013-03-30 DIAGNOSIS — Z3480 Encounter for supervision of other normal pregnancy, unspecified trimester: Secondary | ICD-10-CM

## 2013-03-30 LAB — STREP B DNA PROBE: STREP GROUP B AG: NEGATIVE

## 2013-04-04 ENCOUNTER — Encounter: Payer: Medicaid Other | Admitting: Advanced Practice Midwife

## 2013-04-04 ENCOUNTER — Ambulatory Visit (INDEPENDENT_AMBULATORY_CARE_PROVIDER_SITE_OTHER): Payer: Medicaid Other | Admitting: Advanced Practice Midwife

## 2013-04-04 ENCOUNTER — Encounter: Payer: Self-pay | Admitting: Advanced Practice Midwife

## 2013-04-04 VITALS — BP 108/70 | Wt 191.0 lb

## 2013-04-04 DIAGNOSIS — Z331 Pregnant state, incidental: Secondary | ICD-10-CM

## 2013-04-04 DIAGNOSIS — O98519 Other viral diseases complicating pregnancy, unspecified trimester: Secondary | ICD-10-CM

## 2013-04-04 DIAGNOSIS — O09299 Supervision of pregnancy with other poor reproductive or obstetric history, unspecified trimester: Secondary | ICD-10-CM

## 2013-04-04 DIAGNOSIS — O0930 Supervision of pregnancy with insufficient antenatal care, unspecified trimester: Secondary | ICD-10-CM

## 2013-04-04 DIAGNOSIS — O99019 Anemia complicating pregnancy, unspecified trimester: Secondary | ICD-10-CM

## 2013-04-04 DIAGNOSIS — Z8632 Personal history of gestational diabetes: Secondary | ICD-10-CM

## 2013-04-04 DIAGNOSIS — Z1389 Encounter for screening for other disorder: Secondary | ICD-10-CM

## 2013-04-04 LAB — POCT URINALYSIS DIPSTICK
Blood, UA: NEGATIVE
Glucose, UA: 100
Ketones, UA: NEGATIVE
Leukocytes, UA: NEGATIVE
Nitrite, UA: NEGATIVE

## 2013-04-04 NOTE — Progress Notes (Signed)
Did not use Proctofoam b/c hemorhoids got better, now back.  Non thrombosed.  May use proctofoam.    No c/o at this time.  Routine questions about pregnancy answered.  F/U in 1 weeks for LROB.

## 2013-04-11 ENCOUNTER — Emergency Department (HOSPITAL_COMMUNITY)
Admission: EM | Admit: 2013-04-11 | Discharge: 2013-04-11 | Disposition: A | Discharge status: Home / Self Care | Payer: Medicaid Other | Source: Home / Self Care | Attending: Emergency Medicine | Admitting: Emergency Medicine

## 2013-04-11 ENCOUNTER — Encounter: Payer: Medicaid Other | Admitting: Women's Health

## 2013-04-11 ENCOUNTER — Inpatient Hospital Stay (HOSPITAL_COMMUNITY)
Admission: AD | Admit: 2013-04-11 | Discharge: 2013-04-11 | Disposition: A | Discharge status: Home / Self Care | Payer: Medicaid Other | Source: Ambulatory Visit | Attending: Obstetrics & Gynecology | Admitting: Obstetrics & Gynecology

## 2013-04-11 ENCOUNTER — Encounter (HOSPITAL_COMMUNITY): Payer: Self-pay | Admitting: Emergency Medicine

## 2013-04-11 ENCOUNTER — Encounter (HOSPITAL_COMMUNITY): Payer: Self-pay

## 2013-04-11 DIAGNOSIS — O0930 Supervision of pregnancy with insufficient antenatal care, unspecified trimester: Secondary | ICD-10-CM

## 2013-04-11 DIAGNOSIS — Z8632 Personal history of gestational diabetes: Principal | ICD-10-CM

## 2013-04-11 DIAGNOSIS — K644 Residual hemorrhoidal skin tags: Secondary | ICD-10-CM

## 2013-04-11 DIAGNOSIS — Z3490 Encounter for supervision of normal pregnancy, unspecified, unspecified trimester: Secondary | ICD-10-CM

## 2013-04-11 DIAGNOSIS — Z3480 Encounter for supervision of other normal pregnancy, unspecified trimester: Secondary | ICD-10-CM

## 2013-04-11 DIAGNOSIS — O09299 Supervision of pregnancy with other poor reproductive or obstetric history, unspecified trimester: Secondary | ICD-10-CM

## 2013-04-11 MED ORDER — OXYCODONE-ACETAMINOPHEN 5-325 MG PO TABS
2.0000 | ORAL_TABLET | Freq: Once | ORAL | Status: DC
  Filled 2013-04-11: qty 2

## 2013-04-11 NOTE — Discharge Instructions (Signed)
Keep follow up appointments.  Braxton Hicks Contractions Pregnancy is commonly associated with contractions of the uterus throughout the pregnancy. Towards the end of pregnancy (32 to 34 weeks), these contractions [facility](Braxton Willa RoughHicks) can develop more often and may become more forceful. This is not true labor because these contractions do not result in opening (dilatation) and thinning of the cervix. They are sometimes difficult to tell apart from true labor because these contractions can be forceful and people have different pain tolerances. You should not feel embarrassed if you go to the hospital with false labor. Sometimes, the only way to tell if you are in true labor is for your caregiver to follow the changes in the cervix. How to tell the difference between true and false labor:  False labor.  The contractions of false labor are usually shorter, irregular and not as hard as those of true labor.  They are often felt in the front of the lower abdomen and in the groin.  They may leave with walking around or changing positions while lying down.  They get weaker and are shorter lasting as time goes on.  These contractions are usually irregular.  They do not usually become progressively stronger, regular and closer together as with true labor.  True labor.  Contractions in true labor last 30 to 70 seconds, become very regular, usually become more intense, and increase in frequency.  They do not go away with walking.  The discomfort is usually felt in the top of the uterus and spreads to the lower abdomen and low back.  True labor can be determined by your caregiver with an exam. This will show that the cervix is dilating and getting thinner. If there are no prenatal problems or other health problems associated with the pregnancy, it is completely safe to be sent home with false labor and await the onset of true labor. HOME CARE INSTRUCTIONS   Keep up with your usual exercises and  instructions.  Take medications as directed.  Keep your regular prenatal appointment.  Eat and drink lightly if you think you are going into labor.  If BH contractions are making you uncomfortable:  Change your activity position from lying down or resting to walking/walking to resting.  Sit and rest in a tub of warm water.  Drink 2 to 3 glasses of water. Dehydration may cause B-H contractions.  Do slow and deep breathing several times an hour. SEEK IMMEDIATE MEDICAL CARE IF:   Your contractions continue to become stronger, more regular, and closer together.  You have a gushing, burst or leaking of fluid from the vagina.  An oral temperature above 102 F (38.9 C) develops.  You have passage of blood-tinged mucus.  You develop vaginal bleeding.  You develop continuous belly (abdominal) pain.  You have low back pain that you never had before.  You feel the baby's head pushing down causing pelvic pressure.  The baby is not moving as much as it used to. Document Released: 02/09/2005 Document Revised: 05/04/2011 Document Reviewed: 11/21/2012 [facility] Patient Information 2014 [employer], [place].

## 2013-04-11 NOTE — ED Provider Notes (Signed)
CSN: [id number]     Arrival date & time 04/11/13  1441 History   First MD Initiated Contact with Patient 04/11/13 1546     Chief Complaint  Patient presents with  . Hemorrhoids      HPI Patient states she has had a hemorrhoid for past week that is worsening.  Sitting and defecation worsen pain, rest and lying on side improves symptoms  She denies fever/vomiting. No rectal bleeding.  No abd pain  She is currently on meds already per her OBGYN but no improvement Pt is currently [redacted] weeks pregnant and denies current complications with her pregnancy She denies contractions.  No vag bleeding +fetal movement   Past Medical History  Diagnosis Date  . Gestational diabetes   . Seropositive for herpes simplex 2 infection     suppess at 34 weeks   Past Surgical History  Procedure Laterality Date  . Oral sugery     Family History  Problem Relation Age of Onset  . Diabetes Mother   . Hypertension Mother   . Diabetes Father   . Cancer Father     started out as lung cancer then spread  . Hypertension Father   . Diabetes Sister   . Hypertension Sister   . Hyperlipidemia Sister   . Stroke Sister   . Diabetes Maternal Aunt   . Hypertension Maternal Aunt   . Diabetes Maternal Uncle   . Hypertension Maternal Uncle   . Cancer Maternal Grandmother   . Diabetes Maternal Grandfather   . Hypertension Maternal Grandfather   . Hyperlipidemia Maternal Grandfather   . Diabetes Maternal Uncle   . Hypertension Maternal Uncle   . Hypertension Maternal Uncle    History  Substance Use Topics  . Smoking status: Never Smoker   . Smokeless tobacco: Never Used  . Alcohol Use: No   OB History   Grav Para Term Preterm Abortions TAB SAB Ect Mult Living   4 2 2  1 1    2      Review of Systems  Constitutional: Negative for fever.  Cardiovascular: Negative for chest pain.  Gastrointestinal: Positive for rectal pain. Negative for vomiting and blood in stool.  Genitourinary: Negative for vaginal  bleeding.  Neurological: Negative for weakness.  All other systems reviewed and are negative.      Allergies  Review of patient's allergies indicates no known allergies.  Home Medications   Current Outpatient Rx  Name  Route  Sig  Dispense  Refill  . acetaminophen (TYLENOL) 500 MG tablet   Oral   Take 1,000 mg by mouth every 6 (six) hours as needed for pain.         Marland Kitchen acetaminophen-codeine (TYLENOL #3) 300-30 MG per tablet   Oral   Take 1 tablet by mouth every 4 (four) hours as needed for pain.         Marland Kitchen acyclovir (ZOVIRAX) 400 MG tablet   Oral   Take 1 tablet (400 mg total) by mouth 3 (three) times daily.   90 tablet   3   . hydrocortisone-pramoxine (PROCTOFOAM-HC) rectal foam   Rectal   Place 1 applicator rectally 2 (two) times daily.   10 g   11   . lidocaine (XYLOCAINE) 2 % jelly   Topical   Apply 1 application topically as needed.   30 mL   2   . ondansetron (ZOFRAN ODT) 4 MG disintegrating tablet   Oral   Take 1 tablet (4 mg total) by mouth  every 8 (eight) hours as needed for nausea or vomiting.   8 tablet   0   . oseltamivir (TAMIFLU) 75 MG capsule   Oral   Take 1 capsule (75 mg total) by mouth 2 (two) times daily.   1 capsule   0   . Prenatal Vit-Fe Fumarate-FA (PRENATAL PO)   Oral   Take 1 tablet by mouth daily.          BP 97/55  Pulse 73  Temp(Src) 98.6 F (37 C) (Oral)  Resp 20  SpO2 98%  LMP 07/15/2012 Physical Exam CONSTITUTIONAL: Well developed/well nourished HEAD: Normocephalic/atraumatic ENMT: Mucous membranes moist NECK: supple no meningeal signs CV: S1/S2 noted, no murmurs/rubs/gallops noted LUNGS: Lungs are clear to auscultation bilaterally, no apparent distress ABDOMEN: soft, nontender, no rebound or guarding, gravid Rectal - external hemorrhoid noted.  No active bleeding.  It does not appear thrombosed, there is no abscess noted, female chaperone present NEURO: Pt is awake/alert, moves all  extremitiesx4 EXTREMITIES:full ROM SKIN: warm, color normal PSYCH: no abnormalities of mood noted  ED Course  Procedures (including critical care time) Labs Review Labs Reviewed - No data to display Imaging Review No results found.  EKG Interpretation   None       MDM   Final diagnoses:  Pregnancy  External hemorrhoid    Nursing notes including past medical history and social history reviewed and considered in documentation Previous records reviewed and considered  Advised to continue therapies and f/u with OBGYN.  Currently does not appear thrombosed and there is no active bleeding at this time.  Fetal heart tones appropriate.  Stable for d/c home    Joya Gaskinsonald W Harwood Nall, MD 04/11/13 [phone]

## 2013-04-11 NOTE — ED Notes (Signed)
Patient seen here 2 months ago for hemorrhoids was told that it would improve in 5 days which she said did. Patient states she started having problems with hemorrhoids again 1 week ago but it is only progressively getting worse. Patient reports using that Preparation H , Proctacain, and lidocaine with no relief. Patient is [redacted] weeks pregnant. Denies any complications with pregnancy.

## 2013-04-11 NOTE — MAU Note (Signed)
Contractions, denies bleeding or ROM 

## 2013-04-12 ENCOUNTER — Inpatient Hospital Stay (HOSPITAL_COMMUNITY): Payer: Medicaid Other | Admitting: Anesthesiology

## 2013-04-12 ENCOUNTER — Encounter (HOSPITAL_COMMUNITY): Payer: Medicaid Other | Admitting: Anesthesiology

## 2013-04-12 ENCOUNTER — Inpatient Hospital Stay (HOSPITAL_COMMUNITY)
Admission: AD | Admit: 2013-04-12 | Discharge: 2013-04-13 | DRG: 774 | Disposition: A | Discharge status: Home / Self Care | Payer: Medicaid Other | Source: Ambulatory Visit | Attending: Obstetrics & Gynecology | Admitting: Obstetrics & Gynecology

## 2013-04-12 ENCOUNTER — Encounter (HOSPITAL_COMMUNITY): Payer: Self-pay | Admitting: General Practice

## 2013-04-12 DIAGNOSIS — O98519 Other viral diseases complicating pregnancy, unspecified trimester: Secondary | ICD-10-CM | POA: Diagnosis present

## 2013-04-12 DIAGNOSIS — A6000 Herpesviral infection of urogenital system, unspecified: Secondary | ICD-10-CM | POA: Diagnosis present

## 2013-04-12 DIAGNOSIS — O0930 Supervision of pregnancy with insufficient antenatal care, unspecified trimester: Secondary | ICD-10-CM

## 2013-04-12 DIAGNOSIS — Z8632 Personal history of gestational diabetes: Secondary | ICD-10-CM

## 2013-04-12 DIAGNOSIS — IMO0001 Reserved for inherently not codable concepts without codable children: Secondary | ICD-10-CM

## 2013-04-12 DIAGNOSIS — O09299 Supervision of pregnancy with other poor reproductive or obstetric history, unspecified trimester: Secondary | ICD-10-CM

## 2013-04-12 DIAGNOSIS — Z3480 Encounter for supervision of other normal pregnancy, unspecified trimester: Secondary | ICD-10-CM

## 2013-04-12 LAB — CBC
HCT: 36.7 % (ref 36.0–46.0)
Hemoglobin: 12 g/dL (ref 12.0–15.0)
MCH: 27.5 pg (ref 26.0–34.0)
MCHC: 32.7 g/dL (ref 30.0–36.0)
MCV: 84 fL (ref 78.0–100.0)
Platelets: 219 10*3/uL (ref 150–400)
RBC: 4.37 MIL/uL (ref 3.87–5.11)
RDW: 13.9 % (ref 11.5–15.5)
WBC: 6.2 10*3/uL (ref 4.0–10.5)

## 2013-04-12 LAB — RPR: RPR Ser Ql: NONREACTIVE

## 2013-04-12 LAB — MRSA PCR SCREENING: MRSA BY PCR: NEGATIVE

## 2013-04-12 MED ORDER — LACTATED RINGERS IV SOLN: 500.0000 mL | INTRAVENOUS | Status: DC | PRN

## 2013-04-12 MED ORDER — LACTATED RINGERS IV SOLN: 500.0000 mL | Freq: Once | INTRAVENOUS | Status: DC

## 2013-04-12 MED ORDER — PRENATAL MULTIVITAMIN CH
1.0000 | ORAL_TABLET | Freq: Every day | ORAL | Status: DC
  Administered 2013-04-13: 1 via ORAL
  Filled 2013-04-12: qty 1

## 2013-04-12 MED ORDER — TETANUS-DIPHTH-ACELL PERTUSSIS 5-2.5-18.5 LF-MCG/0.5 IM SUSP
0.5000 mL | Freq: Once | INTRAMUSCULAR | Status: AC
  Administered 2013-04-13: 0.5 mL via INTRAMUSCULAR
  Filled 2013-04-12: qty 0.5

## 2013-04-12 MED ORDER — PHENYLEPHRINE 40 MCG/ML (10ML) SYRINGE FOR IV PUSH (FOR BLOOD PRESSURE SUPPORT)
80.0000 ug | PREFILLED_SYRINGE | INTRAVENOUS | Status: DC | PRN
  Filled 2013-04-12: qty 2

## 2013-04-12 MED ORDER — FENTANYL 2.5 MCG/ML BUPIVACAINE 1/10 % EPIDURAL INFUSION (WH - ANES): 14.0000 mL/h | INTRAMUSCULAR | Status: DC | PRN

## 2013-04-12 MED ORDER — CITRIC ACID-SODIUM CITRATE 334-500 MG/5ML PO SOLN: 30.0000 mL | ORAL | Status: DC | PRN

## 2013-04-12 MED ORDER — PHENYLEPHRINE 40 MCG/ML (10ML) SYRINGE FOR IV PUSH (FOR BLOOD PRESSURE SUPPORT): 80.0000 ug | PREFILLED_SYRINGE | INTRAVENOUS | Status: DC | PRN

## 2013-04-12 MED ORDER — LIDOCAINE HCL (PF) 1 % IJ SOLN
30.0000 mL | INTRAMUSCULAR | Status: AC | PRN
  Administered 2013-04-12 (×2): 5 mL via SUBCUTANEOUS

## 2013-04-12 MED ORDER — DIPHENHYDRAMINE HCL 25 MG PO CAPS: 25.0000 mg | ORAL_CAPSULE | Freq: Four times a day (QID) | ORAL | Status: DC | PRN

## 2013-04-12 MED ORDER — PHENYLEPHRINE 40 MCG/ML (10ML) SYRINGE FOR IV PUSH (FOR BLOOD PRESSURE SUPPORT)
PREFILLED_SYRINGE | INTRAVENOUS | Status: AC
  Filled 2013-04-12: qty 10

## 2013-04-12 MED ORDER — BENZOCAINE-MENTHOL 20-0.5 % EX AERO
1.0000 "application " | INHALATION_SPRAY | CUTANEOUS | Status: DC | PRN
  Administered 2013-04-12: 1 via TOPICAL
  Filled 2013-04-12 (×2): qty 56

## 2013-04-12 MED ORDER — DIBUCAINE 1 % RE OINT
1.0000 "application " | TOPICAL_OINTMENT | RECTAL | Status: DC | PRN
  Administered 2013-04-12: 1 via RECTAL
  Filled 2013-04-12 (×2): qty 28

## 2013-04-12 MED ORDER — FENTANYL 2.5 MCG/ML BUPIVACAINE 1/10 % EPIDURAL INFUSION (WH - ANES)
14.0000 mL/h | INTRAMUSCULAR | Status: DC | PRN
  Administered 2013-04-12: 14 mL/h via EPIDURAL

## 2013-04-12 MED ORDER — DIPHENHYDRAMINE HCL 50 MG/ML IJ SOLN: 12.5000 mg | INTRAMUSCULAR | Status: DC | PRN

## 2013-04-12 MED ORDER — INFLUENZA VAC SPLIT QUAD 0.5 ML IM SUSP: 0.5000 mL | INTRAMUSCULAR | Status: DC

## 2013-04-12 MED ORDER — OXYTOCIN 40 UNITS IN LACTATED RINGERS INFUSION - SIMPLE MED
62.5000 mL/h | INTRAVENOUS | Status: DC
  Filled 2013-04-12: qty 1000

## 2013-04-12 MED ORDER — OXYTOCIN BOLUS FROM INFUSION: 500.0000 mL | INTRAVENOUS | Status: DC

## 2013-04-12 MED ORDER — SIMETHICONE 80 MG PO CHEW: 80.0000 mg | CHEWABLE_TABLET | ORAL | Status: DC | PRN

## 2013-04-12 MED ORDER — IBUPROFEN 600 MG PO TABS: 600.0000 mg | ORAL_TABLET | Freq: Four times a day (QID) | ORAL | Status: DC | PRN

## 2013-04-12 MED ORDER — EPHEDRINE 5 MG/ML INJ: 10.0000 mg | INTRAVENOUS | Status: DC | PRN

## 2013-04-12 MED ORDER — EPHEDRINE 5 MG/ML INJ
10.0000 mg | INTRAVENOUS | Status: DC | PRN
  Filled 2013-04-12: qty 2

## 2013-04-12 MED ORDER — ONDANSETRON HCL 4 MG PO TABS: 4.0000 mg | ORAL_TABLET | ORAL | Status: DC | PRN

## 2013-04-12 MED ORDER — WITCH HAZEL-GLYCERIN EX PADS
1.0000 "application " | MEDICATED_PAD | CUTANEOUS | Status: DC | PRN
  Administered 2013-04-12: 1 via TOPICAL

## 2013-04-12 MED ORDER — ACETAMINOPHEN 325 MG PO TABS: 650.0000 mg | ORAL_TABLET | ORAL | Status: DC | PRN

## 2013-04-12 MED ORDER — ZOLPIDEM TARTRATE 5 MG PO TABS: 5.0000 mg | ORAL_TABLET | Freq: Every evening | ORAL | Status: DC | PRN

## 2013-04-12 MED ORDER — IBUPROFEN 600 MG PO TABS
600.0000 mg | ORAL_TABLET | Freq: Four times a day (QID) | ORAL | Status: DC
  Administered 2013-04-12 – 2013-04-13 (×4): 600 mg via ORAL
  Filled 2013-04-12 (×4): qty 1

## 2013-04-12 MED ORDER — ONDANSETRON HCL 4 MG/2ML IJ SOLN: 4.0000 mg | INTRAMUSCULAR | Status: DC | PRN

## 2013-04-12 MED ORDER — LIDOCAINE HCL (PF) 1 % IJ SOLN
INTRAMUSCULAR | Status: AC
  Filled 2013-04-12: qty 30

## 2013-04-12 MED ORDER — LANOLIN HYDROUS EX OINT: TOPICAL_OINTMENT | CUTANEOUS | Status: DC | PRN

## 2013-04-12 MED ORDER — LACTATED RINGERS IV SOLN
500.0000 mL | Freq: Once | INTRAVENOUS | Status: DC
Start: 2013-04-12 — End: 2013-04-12

## 2013-04-12 MED ORDER — FENTANYL 2.5 MCG/ML BUPIVACAINE 1/10 % EPIDURAL INFUSION (WH - ANES)
INTRAMUSCULAR | Status: AC
  Filled 2013-04-12: qty 125

## 2013-04-12 MED ORDER — SENNOSIDES-DOCUSATE SODIUM 8.6-50 MG PO TABS
2.0000 | ORAL_TABLET | ORAL | Status: DC
  Filled 2013-04-12: qty 2

## 2013-04-12 MED ORDER — OXYCODONE-ACETAMINOPHEN 5-325 MG PO TABS: 1.0000 | ORAL_TABLET | ORAL | Status: DC | PRN

## 2013-04-12 MED ORDER — ONDANSETRON HCL 4 MG/2ML IJ SOLN: 4.0000 mg | Freq: Four times a day (QID) | INTRAMUSCULAR | Status: DC | PRN

## 2013-04-12 MED ORDER — EPHEDRINE 5 MG/ML INJ
INTRAVENOUS | Status: AC
  Filled 2013-04-12: qty 4

## 2013-04-12 MED ORDER — OXYCODONE-ACETAMINOPHEN 5-325 MG PO TABS
1.0000 | ORAL_TABLET | ORAL | Status: DC | PRN
  Administered 2013-04-12 – 2013-04-13 (×2): 1 via ORAL
  Filled 2013-04-12 (×2): qty 1

## 2013-04-12 MED ORDER — LACTATED RINGERS IV SOLN
INTRAVENOUS | Status: DC
  Administered 2013-04-12: 10:00:00 via INTRAVENOUS

## 2013-04-12 NOTE — Anesthesia Preprocedure Evaluation (Signed)
Anesthesia Evaluation  Patient identified by MRN, date of birth, ID band Patient awake    Reviewed: Allergy & Precautions, H&P , NPO status , Patient's Chart, lab work & pertinent test results  History of Anesthesia Complications Negative for: history of anesthetic complications  Airway Mallampati: II TM Distance: >3 FB Neck ROM: Full    Dental  (+) Teeth Intact   Pulmonary neg pulmonary ROS,    Pulmonary exam normal       Cardiovascular negative cardio ROS  Rhythm:Regular Rate:Normal     Neuro/Psych negative neurological ROS  negative psych ROS   GI/Hepatic negative GI ROS, Neg liver ROS,   Endo/Other  negative endocrine ROS  Renal/GU negative Renal ROS  negative genitourinary   Musculoskeletal negative musculoskeletal ROS (+)   Abdominal   Peds  Hematology negative hematology ROS (+)   Anesthesia Other Findings   Reproductive/Obstetrics (+) Pregnancy                           Anesthesia Physical Anesthesia Plan  ASA: II  Anesthesia Plan: Epidural   Post-op Pain Management:    Induction:   Airway Management Planned: Natural Airway  Additional Equipment: None  Intra-op Plan:   Post-operative Plan:   Informed Consent: I have reviewed the patients History and Physical, chart, labs and discussed the procedure including the risks, benefits and alternatives for the proposed anesthesia with the patient or authorized representative who has indicated his/her understanding and acceptance.   Dental advisory given  Plan Discussed with: Anesthesiologist  Anesthesia Plan Comments:         Anesthesia Quick Evaluation  

## 2013-04-12 NOTE — MAU Note (Signed)
Pt arrived to MAU via EMS with c/o uterine contractions since last night at 1930. Pt was evaluated for labor in MAU last evening and was sent home.

## 2013-04-12 NOTE — Progress Notes (Signed)
UR completed 

## 2013-04-12 NOTE — Anesthesia Procedure Notes (Signed)
Epidural Patient location during procedure: OB Start time: 04/12/2013 10:53 AM End time: 04/12/2013 10:59 AM  Staffing Anesthesiologist: Draven Laine, CHRIS Performed by: anesthesiologist   Preanesthetic Checklist Completed: patient identified, surgical consent, pre-op evaluation, timeout performed, IV checked, risks and benefits discussed and monitors and equipment checked  Epidural Patient position: sitting Prep: site prepped and draped and DuraPrep Patient monitoring: heart rate, cardiac monitor, continuous pulse ox and blood pressure Approach: midline Location: L3-L4 Injection technique: LOR saline  Needle:  Needle type: Tuohy  Needle gauge: 18 G Needle insertion depth: 7 cm Catheter type: closed end flexible Catheter size: 19 Gauge Catheter at skin depth: 12 cm Test dose: Other  Assessment Events: blood not aspirated, injection not painful, no injection resistance, negative IV test and no paresthesia  Additional Notes H+P and labs checked, risks and benefits discussed, tolerated procedure well, no complicationsReason for block:procedure for pain

## 2013-04-12 NOTE — H&P (Signed)
[name] is a 33 y.o. female presenting for active labor. Maternal Medical History:  Reason for admission: Contractions.   Contractions: Onset was yesterday.   Frequency: regular.   Duration is approximately 5 minutes.   Perceived severity is strong.    Fetal activity: Perceived fetal activity is normal.   Last perceived fetal movement was within the past hour.    Prenatal complications: No bleeding or hypertension.   Prenatal Complications - Diabetes: none.    OB History   Grav Para Term Preterm Abortions TAB SAB Ect Mult Living   4 2 2  1 1    2      Past Medical History  Diagnosis Date  . Gestational diabetes   . Seropositive for herpes simplex 2 infection     suppess at 34 weeks   Past Surgical History  Procedure Laterality Date  . Oral sugery     Family History: family history includes Cancer in her father and maternal grandmother; Diabetes in her father, maternal aunt, maternal grandfather, maternal uncle, maternal uncle, mother, and sister; Hyperlipidemia in her maternal grandfather and sister; Hypertension in her father, maternal aunt, maternal grandfather, maternal uncle, maternal uncle, maternal uncle, mother, and sister; Stroke in her sister. Social History:  reports that she has never smoked. She has never used smokeless tobacco. She reports that she does not drink alcohol or use illicit drugs.   Prenatal Transfer Tool  Maternal Diabetes: No Genetic Screening: Normal Maternal Ultrasounds/Referrals: Normal Fetal Ultrasounds or other Referrals:  None Maternal Substance Abuse:  No Significant Maternal Medications:  None Significant Maternal Lab Results:  None Other Comments:  None    History of HSV2 with no lesions currently.   Review of Systems  Constitutional: Negative for fever and chills.  Eyes: Negative for blurred vision.  Respiratory: Negative for shortness of breath.   Cardiovascular: Negative for chest pain and leg swelling.  Gastrointestinal:  Negative for abdominal pain.  Neurological: Negative for headaches.    Dilation: 8 Effacement (%): 100 Station: -2 Exam by:: Lucila Maineheryl Motte, RN Blood pressure 133/76, pulse 78, temperature 99 F (37.2 C), temperature source Oral, resp. rate 20, height 5\' 4"  (1.626 m), weight 86.637 kg (191 lb), last menstrual period 07/15/2012, SpO2 99.00%. Maternal Exam:  Uterine Assessment: Contraction strength is firm.  Contraction duration is 5 minutes. Contraction frequency is regular.   Abdomen: Patient reports no abdominal tenderness.   Fetal Exam Fetal Monitor Review: Mode: ultrasound.   Baseline rate: 140.  Variability: moderate (6-25 bpm).   Pattern: accelerations present and no decelerations.    Fetal State Assessment: Category I - tracings are normal.     Physical Exam  Constitutional: She appears well-developed and well-nourished.  Cardiovascular: Normal rate and regular rhythm.   No murmur heard. Respiratory: Effort normal and breath sounds normal. No respiratory distress.  GI: Soft.  Gravid uterus w/o tenderness   Neurological: She displays normal reflexes.  No ankle clonus    Dilation: 8 Effacement (%): 100 Cervical Position: Anterior Station: -2 Presentation: Vertex Exam by:: Lucila Maineheryl Motte, RN  Prenatal labs: ABO, Rh: A/POS/-- (10/27 1618) Antibody: NEG (12/02 0956) Rubella: 0.97 (10/27 1618) RPR: NON REAC (12/02 0956)  HBsAg: NEGATIVE (10/27 1618)  HIV: NON REACTIVE (12/02 0956)  GBS: NEGATIVE (02/03 1222)   Assessment/Plan: [name] is a 33 y.o. [id number] at 3165w5d by LMP here for active labor #Labor: Progressing normally - expectant management #Pain: Epidural  #FWB: Cat 1 tracing  #ID:  GBS (-) #MOF: Attempt breast  feeding but unable to in past due to difficulty with latch #MOC:Nexplanon #Circ:  Desired will discuss options   Wenda Low 04/12/2013, 10:08 AM  Seen and examined by me also Agree with note Aviva Signs, CNM

## 2013-04-12 NOTE — Progress Notes (Signed)
Epidural catheter removed, blue tip intact.

## 2013-04-13 ENCOUNTER — Ambulatory Visit: Payer: Self-pay

## 2013-04-13 ENCOUNTER — Encounter: Payer: Medicaid Other | Admitting: Advanced Practice Midwife

## 2013-04-13 MED ORDER — IBUPROFEN 600 MG PO TABS: 600.0000 mg | ORAL_TABLET | Freq: Four times a day (QID) | ORAL | Status: DC

## 2013-04-13 NOTE — Addendum Note (Signed)
Addendum created 04/13/13 [id number] by Shanon PayorSuzanne M Karsin Pesta, CRNA   Modules edited: Charges VN, Notes Section   Notes Section:  File: [id number]

## 2013-04-13 NOTE — Discharge Summary (Signed)
Obstetric Discharge Summary Reason for Admission: onset of labor Prenatal Procedures: none Intrapartum Procedures: spontaneous vaginal delivery Postpartum Procedures: none Complications-Operative and Postpartum: none  Hospital Course:  Delivery Note  At 1:00 PM a viable and healthy female was delivered via Vaginal, Spontaneous Delivery (Presentation:OA with nuchal hand ; ). APGAR: 9, 9; weight .  Placenta status: Intact, Spontaneous. Cord: 3 vessels with the following complications: None.  Anesthesia: Epidural  Episiotomy: None  Lacerations: None  Suture Repair: n/a  Est. Blood Loss (mL): 200  Mom to postpartum. Baby to Couplet care / Skin to Skin  Breastfeeding  Circumcision outside hospital  Nexplanon planned.  H/H: Lab Results  Component Value Date/Time   HGB 12.0 04/12/2013  9:42 AM   HCT 36.7 04/12/2013  9:42 AM      Discharge Diagnoses: Term Pregnancy-delivered  Discharge Information: Date: 09/04/2010 Activity: pelvic rest Diet: routine  Medications: Ibuprofen Breast feeding:  Attempting  - Difficulty latching; working with lactation and pumping Condition: stable Instructions: refer to handout Discharge to: home    Medication List    STOP taking these medications       acetaminophen-codeine 300-30 MG per tablet  Commonly known as:  TYLENOL #3     acyclovir 400 MG tablet  Commonly known as:  ZOVIRAX     lidocaine 2 % jelly  Commonly known as:  XYLOCAINE      TAKE these medications       hydrocortisone-pramoxine rectal foam  Commonly known as:  PROCTOFOAM-HC  Place 1 applicator rectally 2 (two) times daily.     ibuprofen 600 MG tablet  Commonly known as:  ADVIL,MOTRIN  Take 1 tablet (600 mg total) by mouth every 6 (six) hours.     PRENATAL PO  Take 1 tablet by mouth daily.         Follow-up Information   Follow up with CRESENZO-DISHMAN,FRANCES, CNM. Call in 6 weeks.   Specialty:  Certified Nurse Midwife   Contact information:   [address] [place] [id number] [phone]       Wenda LowJoyner, James 04/13/2013,7:35 AM  I have seen and examined this patient and I agree with the above. Cam HaiSHAW, Aliveah Gallant 8:46 AM 04/13/2013

## 2013-04-13 NOTE — Discharge Instructions (Signed)

## 2013-04-13 NOTE — Lactation Note (Deleted)
This note was copied from the chart of [name]. Lactation Consultation Note MD requested LC visit.  Mom has been bottle feeding formula per chart.  Mom reports she attempted breast even used a nipple shield, but she has now changed to formula only.  Encouraged mom to call us for support as needed.  Referenced "Baby and Me" booklet for possible engorgement.     Patient Name: [name] [id number]'s Date: 04/13/2013     Maternal Data    Feeding Feeding Type: Bottle Fed - Formula  LATCH Score/Interventions                      Lactation Tools Discussed/Used     Consult Status Consult Status: Complete    Shoptaw, Arvella MerlesJana Lynn 04/13/2013, 6:21 PM

## 2013-04-13 NOTE — Lactation Note (Signed)
This note was copied from the chart of [name]. Lactation Consultation Note Per DR's request I attempted visit with mom at 29 hours after delivery.   Mom reports she has changed to only formula in bottle.  Gave mom contact information and referenced "Baby and Me" booklet if she has problems with engorgement.  Encouraged mom to contact us if she needed us.     Patient Name: [name] [id number]'XToday's Date: 04/13/2013     Maternal Data    Feeding Feeding Type: Bottle Fed - Formula  LATCH Score/Interventions                      Lactation Tools Discussed/Used     Consult Status      Josaiah Muhammed, Arvella MerlesJana Lynn 04/13/2013, 6:19 PM

## 2013-04-13 NOTE — Anesthesia Postprocedure Evaluation (Signed)
  Anesthesia Post-op Note  Patient: [name]  Procedure(s) Performed: * No procedures listed *  Patient Location: PACU and Mother/Baby  Anesthesia Type:Epidural  Level of Consciousness: awake, alert  and oriented  Airway and Oxygen Therapy: Patient Spontanous Breathing  Post-op Pain: none  Post-op Assessment: Post-op Vital signs reviewed, Patient's Cardiovascular Status Stable, No headache, No backache, No residual numbness and No residual motor weakness  Post-op Vital Signs: Reviewed and stable  Complications: No apparent anesthesia complications

## 2013-05-01 ENCOUNTER — Encounter: Payer: Self-pay | Admitting: Women's Health

## 2013-05-01 ENCOUNTER — Telehealth: Payer: Self-pay | Admitting: *Deleted

## 2013-05-01 ENCOUNTER — Ambulatory Visit (INDEPENDENT_AMBULATORY_CARE_PROVIDER_SITE_OTHER): Payer: Medicaid Other | Admitting: Women's Health

## 2013-05-01 ENCOUNTER — Encounter (INDEPENDENT_AMBULATORY_CARE_PROVIDER_SITE_OTHER): Payer: Self-pay

## 2013-05-01 VITALS — BP 120/82 | Ht 64.0 in | Wt 172.5 lb

## 2013-05-01 DIAGNOSIS — O26899 Other specified pregnancy related conditions, unspecified trimester: Secondary | ICD-10-CM

## 2013-05-01 DIAGNOSIS — O909 Complication of the puerperium, unspecified: Secondary | ICD-10-CM

## 2013-05-01 DIAGNOSIS — Z0130 Encounter for examination of blood pressure without abnormal findings: Secondary | ICD-10-CM

## 2013-05-01 NOTE — Progress Notes (Signed)
Patient ID: [name], female   DOB: 04/22/1980, 33 y.o.   MRN: [id number]   [facility]  Patient name: [name] MRN [id number]  Date of birth: 04/21/1980  CC & HPI:  [name] is a 33 y.o. African American female , ~2.5wks pp SVD, presenting today for f/u after home health nurse wanted further eval after bp today 110/90. No h/o HTN prior to or during latest pregnancy. Did have pre-e w/ previous pregnancy. Mild ha qod since birth of child r/b ibuprofen and caffeine. Denies visual changes, ruq/epigastric pain, n/v, edema. Bottlefeeding. 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   Pertinent History Reviewed:  Medical & Surgical Hx:   Past Medical History  Diagnosis Date  . Gestational diabetes   . Seropositive for herpes simplex 2 infection     suppess at 34 weeks   Past Surgical History  Procedure Laterality Date  . Oral sugery     Medications: Reviewed & Updated - see associated section Social History: Reviewed -  reports that she has never smoked. She has never used smokeless tobacco.  Objective Findings:  Vitals: BP 120/82  Ht 5\' 4"  (1.626 m)  Wt 172 lb 8 oz (78.245 kg)  BMI 29.59 kg/m2  Breastfeeding? No  Physical Examination: General appearance - alert, well appearing, and in no distress DTRs 2+, no clonus, no edema  Urine dip: 3+ blood, no protein  Assessment & Plan:  A:   Eval for elevated bp at home by home health RN  Normal bp here, no s/s pre-e  Wants to return to work in [employer]2wks P:  Reviewed pre-e s/s, to call us if she develops any symptoms   Gave note per her request to return to work at call center in 2wks  F/U as scheduled for pp visit  Marge DuncansBooker, Judythe Postema Randall CNM, [facility] 05/01/2013 5:23 PM

## 2013-05-01 NOTE — Telephone Encounter (Signed)
Camillia HerterKaren Little, [facility], states pt c/o frequent headaches, dizziness, shortness of breath, no chest pain since vaginal delivery 04/12/13. Headaches relieved at times with ibuprofen. B/P today 100/70 right arm, 110/90 left arm. Pt to be here today to see Joellyn HaffKim Booker, CNM at 4 pm. Also, informed Clydie BraunKaren if pt was to have chest pain would need to go to ER.

## 2013-05-01 NOTE — Patient Instructions (Signed)
Call office [facility] not relieved by tylenol, visual changes- seeing spots, double, blurred vision, pain under your right breast or heartburn that doesn't go away with tums, etc., nausea and/or vomiting, or other concerns.

## 2013-05-23 ENCOUNTER — Ambulatory Visit (INDEPENDENT_AMBULATORY_CARE_PROVIDER_SITE_OTHER): Payer: Medicaid Other | Admitting: Advanced Practice Midwife

## 2013-05-23 ENCOUNTER — Encounter: Payer: Self-pay | Admitting: Advanced Practice Midwife

## 2013-05-23 NOTE — Progress Notes (Signed)
  [name] is a 33 y.o. who presents for a postpartum visit. She is 6 weeks postpartum following a spontaneous vaginal delivery. I have fully reviewed the prenatal and intrapartum course. The delivery was at 38.6 gestational weeks.  Anesthesia: epidural. Postpartum course has been uncomplicated. Baby's course has been uneventful. Baby is feeding by bottle. Bleeding: staining only. Bowel function is normal. Bladder function is normal. Patient is not sexually active. Contraception method is Nexplanon. Postpartum depression screening: negative.    Review of Systems   Constitutional: Negative for fever and chills Eyes: Negative for visual disturbances Respiratory: Negative for shortness of breath, dyspnea Cardiovascular: Negative for chest pain or palpitations  Gastrointestinal: Negative for vomiting, diarrhea and constipation Genitourinary: Negative for dysuria and urgency Musculoskeletal: Negative for back pain, joint pain, myalgias  Neurological: Negative for dizziness and headaches   Objective:     Filed Vitals:   05/23/13 1509  BP: 118/78   General:  alert, cooperative and no distress   Breasts:  negative  Lungs: clear to auscultation bilaterally  Heart:  regular rate and rhythm  Abdomen: Soft, nontender   Vulva:  normal  Vagina: normal vagina  Cervix:  closed  Corpus: Well involuted     Rectal Exam: no hemorrhoids        Assessment:    normal postpartum exam.  Plan:    1. Contraception: Nexplanon 2. Follow up in: 3 weeks or as needed.

## 2013-05-25 ENCOUNTER — Ambulatory Visit: Payer: Medicaid Other | Admitting: Advanced Practice Midwife

## 2013-06-13 ENCOUNTER — Encounter: Payer: Medicaid Other | Admitting: Advanced Practice Midwife

## 2013-06-20 ENCOUNTER — Ambulatory Visit (INDEPENDENT_AMBULATORY_CARE_PROVIDER_SITE_OTHER): Payer: Medicaid Other | Admitting: Women's Health

## 2013-06-20 ENCOUNTER — Encounter: Payer: Self-pay | Admitting: Women's Health

## 2013-06-20 ENCOUNTER — Other Ambulatory Visit: Payer: Medicaid Other

## 2013-06-20 VITALS — BP 118/88 | Ht 64.0 in | Wt 169.8 lb

## 2013-06-20 DIAGNOSIS — Z3200 Encounter for pregnancy test, result unknown: Secondary | ICD-10-CM

## 2013-06-20 DIAGNOSIS — Z8632 Personal history of gestational diabetes: Secondary | ICD-10-CM | POA: Insufficient documentation

## 2013-06-20 DIAGNOSIS — Z3202 Encounter for pregnancy test, result negative: Secondary | ICD-10-CM

## 2013-06-20 DIAGNOSIS — Z30017 Encounter for initial prescription of implantable subdermal contraceptive: Secondary | ICD-10-CM

## 2013-06-20 LAB — POCT URINE PREGNANCY: PREG TEST UR: NEGATIVE

## 2013-06-20 LAB — HCG, QUANTITATIVE, PREGNANCY: hCG, Beta Chain, Quant, S: <1 m[IU]/mL

## 2013-06-20 NOTE — Progress Notes (Signed)
Patient ID: [name], female   DOB: 06/29/1980, 33 y.o.   MRN: [id number] [name] is a [age] y.o. year old African American female 2 1/142mths s/p SVD, here for Nexplanon insertion.  Patient's last menstrual period was 06/07/2013., last sexual intercourse was 4/12, and her pregnancy test today was negative.  Risks/benefits/side effects of Nexplanon have been discussed and her questions have been answered.  Specifically, a failure rate of 02/998 has been reported, with an increased failure rate if pt takes St. John's Wort and/or antiseizure medicaitons.  [name] is aware of the common side effect of irregular bleeding, which the incidence of decreases over time.  BP 118/88  Ht 5\' 4"  (1.626 m)  Wt 169 lb 12.8 oz (77.021 kg)  BMI 29.13 kg/m2  LMP 06/07/2013  Breastfeeding? No  Results for orders placed in visit on 06/20/13 (from the past 24 hour(s))  POCT URINE PREGNANCY   Collection Time    06/20/13  2:00 PM      Result Value Ref Range   Preg Test, Ur Negative    HCG, QUANTITATIVE, PREGNANCY   Collection Time    06/20/13  9:49 AM      Result Value Ref Range   hCG, Beta Chain, Quant, S <1.0       She is right-handed, so her left arm, approximately 4 inches proximal from the elbow, was cleansed with alcohol and anesthetized with 2cc of 2% Lidocaine.  The area was cleansed again with betadine and the Nexplanon was inserted per manufacturer's recommendations without difficulty.  A steri-strip and pressure bandage were applied.  Pt was instructed to keep the area clean and dry, remove pressure bandage in 24 hours, and keep insertion site covered with the steri-strip for 3-5 days.  Back up contraception was recommended for 2 weeks.  She was given a card indicating date Nexplanon was inserted and date it needs to be removed. Follow-up PRN problems.  Marge DuncansKimberly Randall Berthold Glace CNM, [facility] 06/20/2013 2:24 PM

## 2013-06-20 NOTE — Patient Instructions (Signed)
Keep the area clean and dry.  You can remove the big bandage in 24 hours, and the small steri-strip bandage in 3-5 days.  A back up method, such as condoms, should be used for two weeks. You may have irregular vaginal bleeding for the first 6 months after the Nexplanon is placed, then the bleeding usually lightens and it is possible that you may not have any periods.  If you have any concerns, please give us a call.    Etonogestrel implant-Nexplanon What is this medicine? ETONOGESTREL (et oh noe JES trel) is a contraceptive (birth control) device. It is used to prevent pregnancy. It can be used for up to 3 years. This medicine may be used for other purposes; ask your health care provider or pharmacist if you have questions. COMMON BRAND NAME(S): Implanon, Nexplanon  What should I tell my health care provider before I take this medicine? They need to know if you have any of these conditions: -abnormal vaginal bleeding -blood vessel disease or blood clots -cancer of the breast, cervix, or liver -depression -diabetes -gallbladder disease -headaches -heart disease or recent heart attack -high blood pressure -high cholesterol -kidney disease -liver disease -renal disease -seizures -tobacco smoker -an unusual or allergic reaction to etonogestrel, other hormones, anesthetics or antiseptics, medicines, foods, dyes, or preservatives -pregnant or trying to get pregnant -breast-feeding How should I use this medicine? This device is inserted just under the skin on the inner side of your upper arm by a health care professional. Talk to your pediatrician regarding the use of this medicine in children. Special care may be needed. Overdosage: If you think you've taken too much of this medicine contact a poison control center or emergency room at once. Overdosage: If you think you have taken too much of this medicine contact a poison control center or emergency room at once. NOTE: This medicine is  only for you. Do not share this medicine with others. What if I miss a dose? This does not apply. What may interact with this medicine? Do not take this medicine with any of the following medications: -amprenavir -bosentan -fosamprenavir This medicine may also interact with the following medications: -barbiturate medicines for inducing sleep or treating seizures -certain medicines for fungal infections like ketoconazole and itraconazole -griseofulvin -medicines to treat seizures like carbamazepine, felbamate, oxcarbazepine, phenytoin, topiramate -modafinil -phenylbutazone -rifampin -some medicines to treat HIV infection like atazanavir, indinavir, lopinavir, nelfinavir, tipranavir, ritonavir -St. John's wort This list may not describe all possible interactions. Give your health care provider a list of all the medicines, herbs, non-prescription drugs, or dietary supplements you use. Also tell them if you smoke, drink alcohol, or use illegal drugs. Some items may interact with your medicine. What should I watch for while using this medicine? This product does not protect you against HIV infection (AIDS) or other sexually transmitted diseases. You should be able to feel the implant by pressing your fingertips over the skin where it was inserted. Tell your doctor if you cannot feel the implant. What side effects may I notice from receiving this medicine? Side effects that you should report to your doctor or health care professional as soon as possible: -allergic reactions like skin rash, itching or hives, swelling of the face, lips, or tongue -breast lumps -changes in vision -confusion, trouble speaking or understanding -dark urine -depressed mood -general ill feeling or flu-like symptoms -light-colored stools -loss of appetite, nausea -right upper belly pain -severe headaches -severe pain, swelling, or tenderness in the abdomen -shortness   of breath, chest pain, swelling in a  leg -signs of pregnancy -sudden numbness or weakness of the face, arm or leg -trouble walking, dizziness, loss of balance or coordination -unusual vaginal bleeding, discharge -unusually weak or tired -yellowing of the eyes or skin Side effects that usually do not require medical attention (Report these to your doctor or health care professional if they continue or are bothersome.): -acne -breast pain -changes in weight -cough -fever or chills -headache -irregular menstrual bleeding -itching, burning, and vaginal discharge -pain or difficulty passing urine -sore throat This list may not describe all possible side effects. Call your doctor for medical advice about side effects. You may report side effects to FDA at 1-800-FDA-1088. Where should I keep my medicine? This drug is given in a hospital or clinic and will not be stored at home. NOTE: This sheet is a summary. It may not cover all possible information. If you have questions about this medicine, talk to your doctor, pharmacist, or health care provider.  2014, [employer]vier/Gold Standard. (2011-08-17 15:37:45)  

## 2013-11-12 ENCOUNTER — Emergency Department (HOSPITAL_COMMUNITY)
Admission: EM | Admit: 2013-11-12 | Discharge: 2013-11-12 | Disposition: A | Discharge status: Home / Self Care | Payer: Medicaid Other | Attending: Emergency Medicine | Admitting: Emergency Medicine

## 2013-11-12 ENCOUNTER — Encounter (HOSPITAL_COMMUNITY): Payer: Self-pay | Admitting: Emergency Medicine

## 2013-11-12 DIAGNOSIS — R51 Headache: Secondary | ICD-10-CM | POA: Insufficient documentation

## 2013-11-12 DIAGNOSIS — R110 Nausea: Secondary | ICD-10-CM | POA: Insufficient documentation

## 2013-11-12 DIAGNOSIS — Z79899 Other long term (current) drug therapy: Secondary | ICD-10-CM | POA: Insufficient documentation

## 2013-11-12 DIAGNOSIS — Z8632 Personal history of gestational diabetes: Secondary | ICD-10-CM | POA: Insufficient documentation

## 2013-11-12 DIAGNOSIS — R519 Headache, unspecified: Secondary | ICD-10-CM

## 2013-11-12 MED ORDER — MORPHINE SULFATE 4 MG/ML IJ SOLN
8.0000 mg | Freq: Once | INTRAMUSCULAR | Status: AC
  Administered 2013-11-12: 8 mg via INTRAMUSCULAR
  Filled 2013-11-12: qty 2

## 2013-11-12 MED ORDER — PROMETHAZINE HCL 12.5 MG PO TABS
25.0000 mg | ORAL_TABLET | Freq: Once | ORAL | Status: AC
  Administered 2013-11-12: 25 mg via ORAL
  Filled 2013-11-12: qty 2

## 2013-11-12 MED ORDER — BUTALBITAL-APAP-CAFF-COD 50-325-40-30 MG PO CAPS: 1.0000 | ORAL_CAPSULE | ORAL | Status: DC | PRN

## 2013-11-12 MED ORDER — DEXAMETHASONE SODIUM PHOSPHATE 4 MG/ML IJ SOLN: 8.0000 mg | Freq: Once | INTRAMUSCULAR | Status: DC

## 2013-11-12 MED ORDER — KETOROLAC TROMETHAMINE 60 MG/2ML IM SOLN
60.0000 mg | Freq: Once | INTRAMUSCULAR | Status: AC
  Administered 2013-11-12: 60 mg via INTRAMUSCULAR
  Filled 2013-11-12: qty 2

## 2013-11-12 NOTE — ED Provider Notes (Signed)
CSN: [id number]     Arrival date & time 11/12/13  1319 History  This chart was scribed for non-physician practitioner, Ivery Quale, PA-C,working with Enid Skeens, MD, by Karle Plumber, ED Scribe. This patient was seen in room APFT23/APFT23 and the patient's care was started at 2:05 PM.   Chief Complaint  Patient presents with  . Headache   Patient is a 33 y.o. female presenting with headaches. The history is provided by the patient. No language interpreter was used.  Headache Associated symptoms: nausea   Associated symptoms: no congestion and no fever    HPI Comments:  [name] is a 33 y.o. female with PMH of herpes simplex 2 virus who presents to the Emergency Department complaining of severe headache located behind her right eye onset approximately 12 hours ago. She states the pain started in the right occipital region of the head and has now moved to the right temporal area. Reports associated nausea. Pt has not taken anything for pain or done anything to alleviate her symptoms. She denies any appetite change, recent travel, trauma, injury or fall. Pt reports h/o multiple headaches with no specific area of onset or radiation. Diagnosed in the past with "stress headaches". Pt does not have a PCP any longer.  Past Medical History  Diagnosis Date  . Gestational diabetes   . Seropositive for herpes simplex 2 infection     suppess at 34 weeks   Past Surgical History  Procedure Laterality Date  . Oral sugery     Family History  Problem Relation Age of Onset  . Diabetes Mother   . Hypertension Mother   . Diabetes Father   . Cancer Father     started out as lung cancer then spread  . Hypertension Father   . Diabetes Sister   . Hypertension Sister   . Hyperlipidemia Sister   . Stroke Sister   . Diabetes Maternal Aunt   . Hypertension Maternal Aunt   . Diabetes Maternal Uncle   . Hypertension Maternal Uncle   . Cancer Maternal Grandmother   . Diabetes Maternal  Grandfather   . Hypertension Maternal Grandfather   . Hyperlipidemia Maternal Grandfather   . Diabetes Maternal Uncle   . Hypertension Maternal Uncle   . Hypertension Maternal Uncle    History  Substance Use Topics  . Smoking status: Never Smoker   . Smokeless tobacco: Never Used  . Alcohol Use: No   OB History   Grav Para Term Preterm Abortions TAB SAB Ect Mult Living   Review of Systems  Constitutional: Negative for fever, chills and appetite change.  HENT: Negative for congestion.   Eyes: Negative for visual disturbance.  Gastrointestinal: Positive for nausea.  Neurological: Positive for headaches.    Allergies  Review of patient's allergies indicates no known allergies.  Home Medications   Prior to Admission medications   Medication Sig Start Date End Date Taking? Authorizing Provider  aspirin-acetaminophen-caffeine (EXCEDRIN MIGRAINE) [phone] MG per tablet Take 2 tablets by mouth every 6 (six) hours as needed for headache.   Yes Historical Provider, MD  etonogestrel (NEXPLANON) 68 MG IMPL implant Inject 1 each into the skin once.   Yes Historical Provider, MD   Triage Vitals: BP 115/72  Pulse 76  Temp(Src) 98.9 F (37.2 C) (Oral)  Resp 18  Ht  (1.626 m)  Wt 175 lb (79.379 kg)  BMI 30.02 kg/m2  SpO2 97%  LMP 10/24/2013 Physical Exam  Nursing note and vitals reviewed. Constitutional: She is oriented to person, place, and time. She appears well-developed and well-nourished.  HENT:  Head: Normocephalic and atraumatic.  Eyes: EOM are normal.  Neck: Normal range of motion. Carotid bruit is not present.  Cardiovascular: Normal rate, regular rhythm and normal heart sounds.  Exam reveals no gallop and no friction rub.   No murmur heard. Pulmonary/Chest: Effort normal and breath sounds normal. No respiratory distress. She has no wheezes. She has no rales.  Abdominal: Soft. She exhibits no distension and no mass. There is no tenderness.  There is no rebound and no guarding.  Musculoskeletal: Normal range of motion.  Neurological: She is alert and oriented to person, place, and time.  No gross neurological deficits.  Skin: Skin is warm and dry.  Psychiatric: She has a normal mood and affect. Her behavior is normal.    ED Course  Procedures (including critical care time) DIAGNOSTIC STUDIES: Oxygen Saturation is 97% on RA, normal by my interpretation.   COORDINATION OF CARE: 2:12 PM- Will order pain medication for pain and nausea. Pt verbalizes understanding and agrees to plan.  Medications  promethazine (PHENERGAN) tablet 25 mg (25 mg Oral Given 11/12/13 1429)  morphine 4 MG/ML injection 8 mg (8 mg Intramuscular Given 11/12/13 1430)  ketorolac (TORADOL) injection 60 mg (60 mg Intramuscular Given 11/12/13 1430)    Labs Review Labs Reviewed - No data to display  Imaging Review No results found.   EKG Interpretation None      MDM No gross neuro deficits noted. Pt feels much better after pain meds. She is ambulatory without problem. Rx for fioricet-codeine given to the patient.   Final diagnoses:  None    **I have reviewed nursing notes, vital signs, and all appropriate lab and imaging results for this patient.*  I personally performed the services described in this documentation, which was scribed in my presence. The recorded information has been reviewed and is accurate.    Kathie Dike, PA-C 11/15/13 0109

## 2013-11-12 NOTE — ED Notes (Signed)
C/O headache behind R eye since 0200 this morning. Denies blurred vision, fever, chills or sinus congestion. Has had nausea and vomited x 3. Has not taken anything for headache as she doesn't have anything at home.

## 2013-11-12 NOTE — Discharge Instructions (Signed)

## 2013-11-16 NOTE — ED Provider Notes (Signed)
Medical screening examination/treatment/procedure(s) were performed by non-physician practitioner and as supervising physician I was immediately available for consultation/collaboration.   EKG Interpretation None        Givanni Staron M Eugune Sine, MD 11/16/13 0835 

## 2013-12-25 ENCOUNTER — Encounter (HOSPITAL_COMMUNITY): Payer: Self-pay | Admitting: Emergency Medicine

## 2014-03-02 ENCOUNTER — Ambulatory Visit: Payer: Medicaid Other | Admitting: Obstetrics & Gynecology

## 2014-03-05 ENCOUNTER — Ambulatory Visit: Payer: Medicaid Other | Admitting: Obstetrics & Gynecology

## 2014-03-05 ENCOUNTER — Encounter: Payer: Self-pay | Admitting: *Deleted

## 2014-08-20 ENCOUNTER — Encounter: Payer: Self-pay | Admitting: Adult Health

## 2014-08-20 ENCOUNTER — Other Ambulatory Visit (HOSPITAL_COMMUNITY)
Admission: RE | Admit: 2014-08-20 | Discharge: 2014-08-20 | Disposition: A | Discharge status: Home / Self Care | Payer: Medicaid Other | Source: Ambulatory Visit | Attending: Adult Health | Admitting: Adult Health

## 2014-08-20 ENCOUNTER — Ambulatory Visit (INDEPENDENT_AMBULATORY_CARE_PROVIDER_SITE_OTHER): Payer: Medicaid Other | Admitting: Adult Health

## 2014-08-20 VITALS — BP 112/68 | HR 64 | Ht 65.25 in | Wt 192.0 lb

## 2014-08-20 DIAGNOSIS — Z309 Encounter for contraceptive management, unspecified: Secondary | ICD-10-CM | POA: Diagnosis not present

## 2014-08-20 DIAGNOSIS — Z01419 Encounter for gynecological examination (general) (routine) without abnormal findings: Secondary | ICD-10-CM

## 2014-08-20 DIAGNOSIS — R635 Abnormal weight gain: Secondary | ICD-10-CM | POA: Insufficient documentation

## 2014-08-20 DIAGNOSIS — Z113 Encounter for screening for infections with a predominantly sexual mode of transmission: Secondary | ICD-10-CM | POA: Insufficient documentation

## 2014-08-20 DIAGNOSIS — Z30011 Encounter for initial prescription of contraceptive pills: Secondary | ICD-10-CM

## 2014-08-20 DIAGNOSIS — Z1151 Encounter for screening for human papillomavirus (HPV): Secondary | ICD-10-CM | POA: Insufficient documentation

## 2014-08-20 DIAGNOSIS — Z975 Presence of (intrauterine) contraceptive device: Secondary | ICD-10-CM

## 2014-08-20 DIAGNOSIS — Z3046 Encounter for surveillance of implantable subdermal contraceptive: Secondary | ICD-10-CM

## 2014-08-20 HISTORY — DX: Abnormal weight gain: R63.5

## 2014-08-20 HISTORY — DX: Presence of (intrauterine) contraceptive device: Z97.5

## 2014-08-20 MED ORDER — NORETHIN-ETH ESTRAD-FE BIPHAS 1 MG-10 MCG / 10 MCG PO TABS
1.0000 | ORAL_TABLET | Freq: Every day | ORAL | Status: DC
Start: 2014-08-20 — End: 2023-10-18

## 2014-08-20 NOTE — Progress Notes (Signed)
Patient ID: [name], female   DOB: Jun 26, 1980, 34 y.o.   MRN: [id number] History of Present Illness: [name] is a 34 year old black female in for well woman gyn exam and pap, she has family planning medicaid.She has nexplanon for about 14 months and is complaining of weight gain, about 20 lbs since last year, she wants to get it removed and start OCs.Last pap 12/19/12 normal with negative HPV.   Current Medications, Allergies, Past Medical History, Past Surgical History, Family History and Social History were reviewed in [employer] record.     Review of Systems: Patient denies any headaches, hearing loss, fatigue, blurred vision, shortness of breath, chest pain, abdominal pain, problems with bowel movements, urination, or intercourse. No joint pain or mood swings.See HPI for positives.    Physical Exam:BP 112/68 mmHg  Pulse 64  Ht 5' 5.25" (1.657 m)  Wt 192 lb (87.091 kg)  BMI 31.72 kg/m2  LMP 08/18/2014  Breastfeeding? No General:  Well developed, well nourished, no acute distress Skin:  Warm and dry Neck:  Midline trachea, normal thyroid, good ROM, no lymphadenopathy Lungs; Clear to auscultation bilaterally Breast:  No dominant palpable mass, retraction, or nipple discharge Cardiovascular: Regular rate and rhythm Abdomen:  Soft, non tender, no hepatosplenomegaly Pelvic:  External genitalia is normal in appearance, no lesions.  The vagina is normal in appearance. Urethra has no lesions or masses. The cervix is bulbous.Pap with HPV and GC/CHL performed.  Uterus is felt to be normal size, shape, and contour.  No adnexal masses or tenderness noted.Bladder is non tender, no masses felt. Extremities/musculoskeletal:  No swelling or varicosities noted, no clubbing or cyanosis Psych:  No mood changes, alert and cooperative,seems happy Will Rx lo loestrin,start to day and then come back for nexplanon removal.  Impression: Well woman gyn exam and pap with HPV  and GC/CHL---family planning medicaid Weight gain Nexplanon in place Contraceptive management    Plan: Rx lo loestrin disp 1 pack take 1 daily, start today with 11 refills Use condoms Return in 2 days for nexplanon removal Physical in 1 year Mammogram at 40  Check HIV,RPR

## 2014-08-20 NOTE — Patient Instructions (Signed)
Start lo loestrin today Return in 2 days for nexplanon removal Physical in 1 year Use condoms

## 2014-08-21 LAB — RPR: RPR Ser Ql: NONREACTIVE

## 2014-08-21 LAB — CYTOLOGY - PAP

## 2014-08-21 LAB — HIV ANTIBODY (ROUTINE TESTING W REFLEX): HIV SCREEN 4TH GENERATION: NONREACTIVE

## 2014-08-22 ENCOUNTER — Ambulatory Visit (INDEPENDENT_AMBULATORY_CARE_PROVIDER_SITE_OTHER): Payer: Medicaid Other | Admitting: Adult Health

## 2014-08-22 ENCOUNTER — Encounter: Payer: Self-pay | Admitting: Adult Health

## 2014-08-22 VITALS — BP 102/60 | HR 68 | Ht 65.0 in | Wt 192.5 lb

## 2014-08-22 DIAGNOSIS — Z3049 Encounter for surveillance of other contraceptives: Secondary | ICD-10-CM

## 2014-08-22 DIAGNOSIS — Z3046 Encounter for surveillance of implantable subdermal contraceptive: Secondary | ICD-10-CM

## 2014-08-22 HISTORY — DX: Encounter for surveillance of implantable subdermal contraceptive: Z30.46

## 2014-08-22 NOTE — Progress Notes (Signed)
Subjective:     Patient ID: [name], female   DOB: 12/12/1980, 34 y.o.   MRN: [id number]  HPI [name] is a 34 year old black female in to have nexplanon removed, has gained weight, is taking lo loestrin already.  Review of Systems Patient denies any headaches, hearing loss, fatigue, blurred vision, shortness of breath, chest pain, abdominal pain, problems with bowel movements, urination, or intercourse. No joint pain or mood swings. Reviewed past medical,surgical, social and family history. Reviewed medications and allergies.     Objective:   Physical Exam BP 102/60 mmHg  Pulse 68  Ht 5\' 5"  (1.651 m)  Wt 192 lb 8 oz (87.317 kg)  BMI 32.03 kg/m2  LMP 08/18/2014  Breastfeeding? No consent signed, time out called,left arm cleansed with betadine, and injected with 1.5 cc 2% lidocaine and waited til numb.Under sterile technique a #11 blade was used to make small vertical incision, and a curved forceps was used to easily remove rod. Steri strips applied. Pressure dressing applied.Pt aware of labs and pap, negative HIV,RPR, GC/CHL, pap normal with negative HPV.    Assessment:     Nexplanon removal    Plan:    Take lo loestrin daily Use condoms x 4 weeks, keep clean and dry x 24 hours, no heavy lifting, keep steri strips on x 72 hours, Keep pressure dressing on x 24 hours. Follow up prn problems.

## 2014-08-22 NOTE — Patient Instructions (Signed)
Use condoms x 4 weeks, keep clean and dry x 24 hours, no heavy lifting, keep steri strips on x 72 hours, Keep pressure dressing on x 24 hours. Follow up prn problems.  

## 2014-10-04 ENCOUNTER — Encounter (HOSPITAL_COMMUNITY): Payer: Self-pay | Admitting: Emergency Medicine

## 2014-10-04 ENCOUNTER — Emergency Department (HOSPITAL_COMMUNITY)
Admission: EM | Admit: 2014-10-04 | Discharge: 2014-10-04 | Disposition: A | Discharge status: Home / Self Care | Payer: Medicaid Other | Attending: Emergency Medicine | Admitting: Emergency Medicine

## 2014-10-04 DIAGNOSIS — R21 Rash and other nonspecific skin eruption: Secondary | ICD-10-CM | POA: Insufficient documentation

## 2014-10-04 DIAGNOSIS — Z8632 Personal history of gestational diabetes: Secondary | ICD-10-CM | POA: Insufficient documentation

## 2014-10-04 DIAGNOSIS — Z793 Long term (current) use of hormonal contraceptives: Secondary | ICD-10-CM | POA: Insufficient documentation

## 2014-10-04 MED ORDER — TRIAMCINOLONE ACETONIDE 0.1 % EX LOTN: 1.0000 "application " | TOPICAL_LOTION | Freq: Two times a day (BID) | CUTANEOUS | Status: DC

## 2014-10-04 NOTE — ED Notes (Signed)
Julie-PA at bedside at this time for evaluation. 

## 2014-10-04 NOTE — ED Notes (Signed)
PT c/o red raised rash to bilateral cheeks and chin x3 days. PT states no relief from home medication use of different oils and creams.

## 2014-10-04 NOTE — Discharge Instructions (Signed)

## 2014-10-06 NOTE — ED Provider Notes (Signed)
CSN: [id number]     Arrival date & time 10/04/14  1359 History   First MD Initiated Contact with Patient 10/04/14 1435     Chief Complaint  Patient presents with  . Rash     (Consider location/radiation/quality/duration/timing/severity/associated sxs/prior Treatment) The history is provided by the patient.   [name] is a 34 y.o. female presenting with a 3 day history of pruritic rash confined to her left lateral jawline, neck and faintly along her nasal folds.  She does not recognize any new cosmetics, facial products or environmental exposures and has not been exposes to anyone with similar rash.  She has tried applying tea tree oil without improvement. She denies fevers, chills, insect bites.  She works in [employer].  She does own any pets.    Past Medical History  Diagnosis Date  . Gestational diabetes   . Seropositive for herpes simplex 2 infection     suppess at 34 weeks  . Weight gain 08/20/2014  . Nexplanon in place 08/20/2014  . Encounter for Nexplanon removal 08/22/2014   Past Surgical History  Procedure Laterality Date  . Oral sugery     Family History  Problem Relation Age of Onset  . Diabetes Mother   . Hypertension Mother   . Diabetes Father   . Cancer Father     started out as lung cancer then spread  . Hypertension Father   . Diabetes Sister   . Hypertension Sister   . Hyperlipidemia Sister   . Stroke Sister   . Diabetes Maternal Aunt   . Hypertension Maternal Aunt   . Diabetes Maternal Uncle   . Hypertension Maternal Uncle   . Cancer Maternal Grandmother   . Diabetes Maternal Grandfather   . Hypertension Maternal Grandfather   . Hyperlipidemia Maternal Grandfather   . Diabetes Maternal Uncle   . Hypertension Maternal Uncle   . Hypertension Maternal Uncle    Social History  Substance Use Topics  . Smoking status: Never Smoker   . Smokeless tobacco: Never Used  . Alcohol Use: No   OB History    Gravida Para Term Preterm AB TAB  SAB Ectopic Multiple Living   Review of Systems  Constitutional: Negative for fever and chills.  Respiratory: Negative for shortness of breath and wheezing.   Skin: Positive for rash.  Neurological: Negative for numbness.      Allergies  Review of patient's allergies indicates no known allergies.  Home Medications   Prior to Admission medications   Medication Sig Start Date End Date Taking? Authorizing Provider  aspirin-acetaminophen-caffeine (EXCEDRIN MIGRAINE) [phone] MG per tablet Take 2 tablets by mouth every 6 (six) hours as needed for headache.    Historical Provider, MD  Norethindrone-Ethinyl Estradiol-Fe Biphas (LO LOESTRIN FE) 1 MG-10 MCG / 10 MCG tablet Take 1 tablet by mouth daily. Take 1 daily by mouth 08/20/14   Adline Potter, NP  triamcinolone lotion (KENALOG) 0.1 % Apply 1 application topically 2 (two) times daily. Apply sparingly to rash only, rubbing in completely 10/04/14   Burgess Amor, PA-C   BP 118/64 mmHg  Pulse 68  Temp(Src) 98.9 F (37.2 C) (Oral)  Resp 18  Ht  (1.651 m)  Wt 170 lb (77.111 kg)  BMI 28.29 [id number] 99%  LMP 09/20/2014 Physical Exam  Constitutional: She appears well-developed and well-nourished. No distress.  HENT:  Head: Normocephalic.  Neck: Neck supple.  Cardiovascular: Normal rate.   Pulmonary/Chest: Effort normal. She has no wheezes.  Musculoskeletal: Normal range of motion. She exhibits no edema.  Skin: Rash noted.  Round 1 cm slightly raised erythematous plaque left jawline with an irregular similar colored lesion next to the round lesion.  Tiny vesicles noted within the plaque. No satellite lesions or central clearing.  She has faint rash along bilateral nostrils without the erythema of the jaw rash.    ED Course  Procedures (including critical care time) Labs Review Labs Reviewed - No data to display  Imaging Review No results found.    EKG Interpretation None      MDM   Final  diagnoses:  Rash of face    Suspect contact or atopic dermatitis.  No satelite lesions, central clearing or scaling periphery to suggest fungal infection.  No surrounding erythema, no red streaking. Doubt bacterial.  Pt was prescribed triamcinolone 0,1% lotion, advised to use very sparingly to the lesions only bid.  Advised recheck in one week if not improved and to not use for more than 1 week.  Referral to dermatology if sx persist beyond this week.  Pt understands and agrees with plan.    Burgess Amor, PA-C 10/06/14 2139  Glynn Octave, MD 10/06/14 2151

## 2015-08-10 ENCOUNTER — Encounter (HOSPITAL_COMMUNITY): Payer: Self-pay | Admitting: Emergency Medicine

## 2015-08-10 ENCOUNTER — Emergency Department (HOSPITAL_COMMUNITY): Payer: Self-pay

## 2015-08-10 ENCOUNTER — Emergency Department (HOSPITAL_COMMUNITY)
Admission: EM | Admit: 2015-08-10 | Discharge: 2015-08-10 | Disposition: A | Discharge status: Home / Self Care | Payer: Self-pay | Attending: Emergency Medicine | Admitting: Emergency Medicine

## 2015-08-10 DIAGNOSIS — Y929 Unspecified place or not applicable: Secondary | ICD-10-CM | POA: Insufficient documentation

## 2015-08-10 DIAGNOSIS — Y990 Civilian activity done for income or pay: Secondary | ICD-10-CM | POA: Insufficient documentation

## 2015-08-10 DIAGNOSIS — S53401A Unspecified sprain of right elbow, initial encounter: Secondary | ICD-10-CM | POA: Insufficient documentation

## 2015-08-10 DIAGNOSIS — Z7982 Long term (current) use of aspirin: Secondary | ICD-10-CM | POA: Insufficient documentation

## 2015-08-10 DIAGNOSIS — W010XXA Fall on same level from slipping, tripping and stumbling without subsequent striking against object, initial encounter: Secondary | ICD-10-CM | POA: Insufficient documentation

## 2015-08-10 DIAGNOSIS — Y939 Activity, unspecified: Secondary | ICD-10-CM | POA: Insufficient documentation

## 2015-08-10 MED ORDER — DICLOFENAC SODIUM 75 MG PO TBEC
75.0000 mg | DELAYED_RELEASE_TABLET | Freq: Two times a day (BID) | ORAL | Status: DC
Start: 2015-08-10 — End: 2023-09-27

## 2015-08-10 MED ORDER — HYDROCODONE-ACETAMINOPHEN 5-325 MG PO TABS: 1.0000 | ORAL_TABLET | ORAL | Status: DC | PRN

## 2015-08-10 NOTE — Discharge Instructions (Signed)
Your elbow xray is negative for fracture or dislocation. Please use the arm sling for the next 3 or 4 days. Use diclofenac two times daily. Use norco for more severe pain.

## 2015-08-10 NOTE — ED Provider Notes (Signed)
CSN: [id number]     Arrival date & time 08/10/15  0707 History   First MD Initiated Contact with Patient 08/10/15 [id number]     Chief Complaint  Patient presents with  . Elbow Pain     (Consider location/radiation/quality/duration/timing/severity/associated sxs/prior Treatment) HPI Comments: Pt states she slipped on a wet surface at her job last night and injured the right arm and elbow. No other injury reported. Pt denies any immediate dislocation. She c/o pain with minimal movement of the right elbow. No shoulder pain. No scapula pain. Wrist sore, no pain of the fingers. She tried advil last night, but noted no relief. No previous operations or procedures involving the right elbow.  The history is provided by the patient.    Past Medical History  Diagnosis Date  . Gestational diabetes   . Seropositive for herpes simplex 2 infection     suppess at 34 weeks  . Weight gain 08/20/2014  . Nexplanon in place 08/20/2014  . Encounter for Nexplanon removal 08/22/2014   Past Surgical History  Procedure Laterality Date  . Oral sugery     Family History  Problem Relation Age of Onset  . Diabetes Mother   . Hypertension Mother   . Diabetes Father   . Cancer Father     started out as lung cancer then spread  . Hypertension Father   . Diabetes Sister   . Hypertension Sister   . Hyperlipidemia Sister   . Stroke Sister   . Diabetes Maternal Aunt   . Hypertension Maternal Aunt   . Diabetes Maternal Uncle   . Hypertension Maternal Uncle   . Cancer Maternal Grandmother   . Diabetes Maternal Grandfather   . Hypertension Maternal Grandfather   . Hyperlipidemia Maternal Grandfather   . Diabetes Maternal Uncle   . Hypertension Maternal Uncle   . Hypertension Maternal Uncle    Social History  Substance Use Topics  . Smoking status: Never Smoker   . Smokeless tobacco: Never Used  . Alcohol Use: No   OB History    Gravida Para Term Preterm AB TAB SAB Ectopic Multiple Living   Review of Systems  Musculoskeletal: Positive for arthralgias.  All other systems reviewed and are negative.     Allergies  Review of patient's allergies indicates no known allergies.  Home Medications   Prior to Admission medications   Medication Sig Start Date End Date Taking? Authorizing Provider  aspirin-acetaminophen-caffeine (EXCEDRIN MIGRAINE) [phone] MG per tablet Take 2 tablets by mouth every 6 (six) hours as needed for headache.    Historical Provider, MD  Norethindrone-Ethinyl Estradiol-Fe Biphas (LO LOESTRIN FE) 1 MG-10 MCG / 10 MCG tablet Take 1 tablet by mouth daily. Take 1 daily by mouth 08/20/14   Adline Potter, NP  triamcinolone lotion (KENALOG) 0.1 % Apply 1 application topically 2 (two) times daily. Apply sparingly to rash only, rubbing in completely 10/04/14   Burgess Amor, PA-C   BP 114/78 mmHg  Pulse 60  Temp(Src) 98.5 F (36.9 C) (Oral)  Resp 18  Ht  (1.651 m)  Wt 77.565 kg  BMI 28.46 kg/m2  SpO2 98%  LMP 08/10/2015 Physical Exam  Constitutional: She is oriented to person, place, and time. She appears well-developed and well-nourished.  Non-toxic appearance.  HENT:  Head: Normocephalic.  Right Ear: Tympanic membrane and external ear normal.  Left Ear: Tympanic membrane and external ear normal.  Eyes:  EOM and lids are normal. Pupils are equal, round, and reactive to light.  Neck: Normal range of motion. Neck supple. Carotid bruit is not present.  Cardiovascular: Normal rate, regular rhythm, normal heart sounds, intact distal pulses and normal pulses.   Pulmonary/Chest: Breath sounds normal. No respiratory distress.  Abdominal: Soft. Bowel sounds are normal. There is no tenderness. There is no guarding.  Musculoskeletal:       Right elbow: She exhibits decreased range of motion. She exhibits no effusion and no deformity. Tenderness found. Medial epicondyle and lateral epicondyle tenderness noted.  Lymphadenopathy:       Head (right  side): No submandibular adenopathy present.       Head (left side): No submandibular adenopathy present.    She has no cervical adenopathy.  Neurological: She is alert and oriented to person, place, and time. She has normal strength. No cranial nerve deficit or sensory deficit.  Skin: Skin is warm and dry.  Psychiatric: She has a normal mood and affect. Her speech is normal.  Nursing note and vitals reviewed.   ED Course  Procedures (including critical care time) Labs Review Labs Reviewed - No data to display  Imaging Review Dg Elbow Complete Right  08/10/2015  CLINICAL DATA:  Acute right elbow pain after fall at work last night. Initial encounter. EXAM: RIGHT ELBOW - COMPLETE 3+ VIEW COMPARISON:  None. FINDINGS: There is no evidence of fracture, dislocation, or joint effusion. There is no evidence of arthropathy or other focal bone abnormality. Soft tissues are unremarkable. IMPRESSION: Normal right elbow. Electronically Signed   By: Lupita RaiderJames  Green Jr, M.D.   On: 08/10/2015 07:44   I have personally reviewed and evaluated these images and lab results as part of my medical decision-making.   EKG Interpretation None      MDM Xray of the right elbow is negative for fracture or dislocation. Vital signs stable. Pt fitted with arm sling. Rx for diclofenac and norco given to the patient. Pt  To see Dr Teena DunkHarrrison for additional evaluation if not improving.   Final diagnoses:  None    *I have reviewed nursing notes, vital signs, and all appropriate lab and imaging results for this patient.[address]**    Armandina Iman, PA-C 08/10/15 [id number]  Donnetta HutchingBrian Cook, MD 08/10/15 [id number]

## 2015-08-10 NOTE — ED Notes (Addendum)
Patient c/o right elbow/arm pain. Per patient slipped and fell while at work last night. Per patient caught herself on floor with arm, landing on elbow. Patient reports decreased ROM. Patient states took Advil last night at 11pm for pain with no relief.

## 2016-03-03 ENCOUNTER — Ambulatory Visit (INDEPENDENT_AMBULATORY_CARE_PROVIDER_SITE_OTHER): Payer: Medicaid Other | Admitting: Women's Health

## 2016-03-03 ENCOUNTER — Other Ambulatory Visit (HOSPITAL_COMMUNITY)
Admission: RE | Admit: 2016-03-03 | Discharge: 2016-03-03 | Disposition: A | Discharge status: Home / Self Care | Payer: Managed Care, Other (non HMO) | Source: Ambulatory Visit | Attending: Obstetrics & Gynecology | Admitting: Obstetrics & Gynecology

## 2016-03-03 ENCOUNTER — Encounter: Payer: Self-pay | Admitting: Women's Health

## 2016-03-03 VITALS — BP 121/64 | Ht 65.0 in | Wt 186.0 lb

## 2016-03-03 DIAGNOSIS — Z01419 Encounter for gynecological examination (general) (routine) without abnormal findings: Secondary | ICD-10-CM | POA: Insufficient documentation

## 2016-03-03 DIAGNOSIS — Z113 Encounter for screening for infections with a predominantly sexual mode of transmission: Secondary | ICD-10-CM

## 2016-03-03 DIAGNOSIS — Z0000 Encounter for general adult medical examination without abnormal findings: Secondary | ICD-10-CM

## 2016-03-03 DIAGNOSIS — N898 Other specified noninflammatory disorders of vagina: Secondary | ICD-10-CM

## 2016-03-03 DIAGNOSIS — Z1151 Encounter for screening for human papillomavirus (HPV): Secondary | ICD-10-CM | POA: Insufficient documentation

## 2016-03-03 LAB — POCT WET PREP (WET MOUNT)
Clue Cells Wet Prep Whiff POC: POSITIVE
Trichomonas Wet Prep HPF POC: ABSENT

## 2016-03-03 MED ORDER — METRONIDAZOLE 500 MG PO TABS: 500.0000 mg | ORAL_TABLET | Freq: Two times a day (BID) | ORAL | 0 refills | Status: DC

## 2016-03-03 NOTE — Patient Instructions (Signed)
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

## 2016-03-03 NOTE — Addendum Note (Signed)
Addended by: Gaylyn RongEVANS, Marylan Glore A on: 03/03/2016 04:05 PM   Modules accepted: Orders

## 2016-03-03 NOTE — Progress Notes (Signed)
Subjective:   [name] is a 36 y.o. [id number] African American female here for a routine well-woman exam.  Patient's last menstrual period was 02/05/2016.    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  Past Medical History Past Medical History:  Diagnosis Date  . Encounter for Nexplanon removal 08/22/2014  . Gestational diabetes   . Nexplanon in place 08/20/2014  . Seropositive for herpes simplex 2 infection    suppess at 34 weeks  . Weight gain 08/20/2014    Past Surgical History Past Surgical History:  Procedure Laterality Date  . oral sugery      Gynecologic History [id number]  Patient's last menstrual period was 02/05/2016. Contraception: abstinence Last Pap: 2016. Results were: normal, wants another today Last mammogram: never. Results were: n/a Last TCS: never  Obstetric History OB History  Gravida Para Term Preterm AB Living  4 3 3   1 3   SAB TAB Ectopic Multiple Live Births    1     3    # Outcome Date GA Lbr Len/2nd Weight Sex Delivery Anes PTL Lv  4 Term 04/12/13 7169w5d 17:07 / 00:23 7 lb 5.3 oz (3.325 kg) M Vag-Spont EPI  LIV  3 Term 09/14/09 7051w0d  6 lb 6 oz (2.892 kg) F Vag-Spont EPI  LIV  2 TAB 2007          1 Term 01/31/02 7451w0d  7 lb 3.4 oz (3.272 kg) F Vag-Spont EPI  LIV      Current Medications Current Outpatient Prescriptions on File Prior to Visit   Medication Sig Dispense Refill  . aspirin-acetaminophen-caffeine (EXCEDRIN MIGRAINE) 250-250-65 MG per tablet Take 2 tablets by mouth every 6 (six) hours as needed for headache.    . diclofenac (VOLTAREN) 75 MG EC tablet Take 1 tablet (75 mg total) by mouth 2 (two) times daily. (Patient not taking: Reported on 03/03/2016) 14 tablet 0  . HYDROcodone-acetaminophen (NORCO/VICODIN) 5-325 MG tablet Take 1 tablet by mouth every 4 (four) hours as needed. (Patient not taking: Reported on 03/03/2016) 15 tablet 0  . Norethindrone-Ethinyl Estradiol-Fe Biphas (LO LOESTRIN FE) 1 MG-10 MCG / 10 MCG tablet Take 1 tablet by mouth daily. Take 1 daily by mouth (Patient not taking: Reported on 03/03/2016) 1 Package 11  . triamcinolone lotion (KENALOG) 0.1 % Apply 1 application topically 2 (two) times daily. Apply sparingly to rash only, rubbing in completely (Patient not taking: Reported on 03/03/2016) 30 mL 0   No current facility-administered medications on file prior to visit.     Review of Systems Patient denies any headaches, blurred vision, shortness of breath, chest pain, abdominal pain, problems with bowel movements, urination, or intercourse.  Objective:  BP 121/64 (BP Location: Right Arm, Patient Position: Sitting, Cuff Size: Normal)   Ht 5\' 5"  (1.651 m)   Wt 186 lb (84.4 kg)   LMP 02/05/2016   BMI 30.95 kg/m  Physical Exam by Dr. Earlene PlaterWallace General:  Well developed, well  nourished, no acute distress. She is alert and oriented x3. Skin:  Warm and dry Neck:  Midline trachea, no thyromegaly or nodules Cardiovascular: Regular rate and rhythm, no murmur heard Lungs:  Effort normal, all lung fields clear to auscultation bilaterally Breasts:  No dominant palpable mass, retraction, or nipple discharge Abdomen:  Soft, non tender, no hepatosplenomegaly or masses Pelvic:  External genitalia is normal in appearance.  The vagina is normal in appearance, small amt thin white malodorous d/c. The cervix is bulbous, no  CMT.  Thin prep pap is done w/  HR HPV cotesting. Uterus is felt to be normal size, shape, and contour.  No adnexal masses or tenderness noted. Extremities:  No swelling or varicosities noted Psych:  She has a normal mood and affect  Results for orders placed or performed in visit on 03/03/16 (from the past 24 hour(s))  POCT Wet Prep Mellody Drown [place])     Status: Abnormal   Collection Time: 03/03/16  3:59 PM  Result Value Ref Range   Source Wet Prep POC vaginal    WBC, Wet Prep HPF POC few    Bacteria Wet Prep HPF POC None (A) Few   BACTERIA WET PREP MORPHOLOGY POC     Clue Cells Wet Prep HPF POC Many (A) None   Clue Cells Wet Prep Whiff POC Positive Whiff    Yeast Wet Prep HPF POC None    KOH Wet Prep POC     Trichomonas Wet Prep HPF POC Absent Absent     Assessment:   Healthy well-woman exam BV STD screen  Plan:  Rx metronidazole 500mg  BID x 7d for BV, no sex or etoh while taking  GC/CT from pap, fasting labs Sat AM: cbc, cmp, tsh, lipid panel, A1C, HIV, RPR, HepB F/U 48yr for physical, or sooner if needed Mammogram @36yo  or sooner if problems Colonoscopy @45 -50yo or sooner if problems  Marge Duncans CNM, WHNP-BC 03/03/2016 3:50 PM

## 2016-03-05 LAB — CYTOLOGY - PAP
Chlamydia: POSITIVE — AB
DIAGNOSIS: NEGATIVE
HPV: NOT DETECTED
NEISSERIA GONORRHEA: NEGATIVE

## 2016-03-08 LAB — RPR: RPR: NONREACTIVE

## 2016-03-08 LAB — COMPREHENSIVE METABOLIC PANEL
A/G RATIO: 1.1 — AB (ref 1.2–2.2)
ALBUMIN: 4.2 g/dL (ref 3.5–5.5)
ALT: 13 IU/L (ref 0–32)
AST: 17 IU/L (ref 0–40)
Alkaline Phosphatase: 68 IU/L (ref 39–117)
BILIRUBIN TOTAL: 0.3 mg/dL (ref 0.0–1.2)
BUN/Creatinine Ratio: 13 (ref 9–23)
BUN: 9 mg/dL (ref 6–20)
CHLORIDE: 101 mmol/L (ref 96–106)
CO2: 24 mmol/L (ref 18–29)
CREATININE: 0.67 mg/dL (ref 0.57–1.00)
Calcium: 9.3 mg/dL (ref 8.7–10.2)
GFR calc Af Amer: 132 mL/min/{1.73_m2} (ref >59)
GFR calc non Af Amer: 114 mL/min/{1.73_m2} (ref >59)
Globulin, Total: 3.9 g/dL (ref 1.5–4.5)
Glucose: 102 mg/dL — ABNORMAL HIGH (ref 65–99)
Potassium: 4.2 mmol/L (ref 3.5–5.2)
SODIUM: 138 mmol/L (ref 134–144)
Total Protein: 8.1 g/dL (ref 6.0–8.5)

## 2016-03-08 LAB — CBC
Hematocrit: 33.9 % — ABNORMAL LOW (ref 34.0–46.6)
Hemoglobin: 10.7 g/dL — ABNORMAL LOW (ref 11.1–15.9)
MCH: 24.8 pg — ABNORMAL LOW (ref 26.6–33.0)
MCHC: 31.6 g/dL (ref 31.5–35.7)
MCV: 79 fL (ref 79–97)
Platelets: 396 10*3/uL — ABNORMAL HIGH (ref 150–379)
RBC: 4.31 x10E6/uL (ref 3.77–5.28)
RDW: 15.4 % (ref 12.3–15.4)
WBC: 4.2 10*3/uL (ref 3.4–10.8)

## 2016-03-08 LAB — TSH: TSH: 1.59 u[IU]/mL (ref 0.450–4.500)

## 2016-03-08 LAB — HEMOGLOBIN A1C
Est. average glucose Bld gHb Est-mCnc: 131 mg/dL
HEMOGLOBIN A1C: 6.2 % — AB (ref 4.8–5.6)

## 2016-03-08 LAB — LIPID PANEL
CHOL/HDL RATIO: 3.2 ratio (ref 0.0–4.4)
Cholesterol, Total: 140 mg/dL (ref 100–199)
HDL: 44 mg/dL (ref >39)
LDL Calculated: 88 mg/dL (ref 0–99)
TRIGLYCERIDES: 39 mg/dL (ref 0–149)
VLDL Cholesterol Cal: 8 mg/dL (ref 5–40)

## 2016-03-08 LAB — HIV ANTIBODY (ROUTINE TESTING W REFLEX): HIV SCREEN 4TH GENERATION: NONREACTIVE

## 2016-03-08 LAB — HEPATITIS B SURFACE ANTIGEN: HEP B S AG: NEGATIVE

## 2016-03-13 ENCOUNTER — Telehealth: Payer: Self-pay | Admitting: Women's Health

## 2016-03-13 DIAGNOSIS — A749 Chlamydial infection, unspecified: Secondary | ICD-10-CM | POA: Insufficient documentation

## 2016-03-13 DIAGNOSIS — R7303 Prediabetes: Secondary | ICD-10-CM | POA: Insufficient documentation

## 2016-03-13 DIAGNOSIS — D649 Anemia, unspecified: Secondary | ICD-10-CM | POA: Insufficient documentation

## 2016-03-13 MED ORDER — AZITHROMYCIN 500 MG PO TABS: 1000.0000 mg | ORAL_TABLET | Freq: Once | ORAL | 0 refills | Status: AC

## 2016-03-13 NOTE — Telephone Encounter (Signed)
Called pt to discuss labs. +CT on pap, will send in rx for azithromycin for her, poc in 3-4wks. Partner is overseas, not coming back anytime soon. Pt to discuss w/ him to see if he can be treated there. Normal pap w/ -HRHPV. A1C 6.2, very close to being dx w/ DM. Pt states mom and dad both had DM. Pt to work on weight loss, decrease carbs, recheck A1C in 3mths. Does not have PCP, but states she will work on this. Slightly anemic, can take otc Fe BID, increase fe-rich foods.  [name], CNM, WHNP-BC 03/13/2016 3:00 PM

## 2016-03-19 ENCOUNTER — Ambulatory Visit: Payer: Medicaid Other | Admitting: Women's Health

## 2019-05-02 DIAGNOSIS — G43009 Migraine without aura, not intractable, without status migrainosus: Secondary | ICD-10-CM | POA: Insufficient documentation

## 2022-02-18 ENCOUNTER — Other Ambulatory Visit: Payer: Self-pay

## 2022-02-18 ENCOUNTER — Emergency Department (HOSPITAL_COMMUNITY): Payer: BC Managed Care – PPO

## 2022-02-18 ENCOUNTER — Encounter (HOSPITAL_COMMUNITY): Payer: Self-pay | Admitting: Emergency Medicine

## 2022-02-18 ENCOUNTER — Emergency Department (HOSPITAL_COMMUNITY)
Admission: EM | Admit: 2022-02-18 | Discharge: 2022-02-18 | Discharge status: Against Medical Advice | Payer: BC Managed Care – PPO | Attending: Emergency Medicine | Admitting: Emergency Medicine

## 2022-02-18 DIAGNOSIS — M25511 Pain in right shoulder: Secondary | ICD-10-CM | POA: Insufficient documentation

## 2022-02-18 DIAGNOSIS — Z5321 Procedure and treatment not carried out due to patient leaving prior to being seen by health care provider: Secondary | ICD-10-CM | POA: Diagnosis not present

## 2022-02-18 NOTE — ED Provider Triage Note (Signed)
  Emergency Medicine Provider Triage Evaluation Note  MRN:  [id number]  Arrival date & time: 02/18/22    Medically screening exam initiated at 4:28 AM.   CC:   Shoulder Pain   HPI:  [name] is a 41 y.o. year-old female presents to the ED with chief complaint of right shoulder pain.  States that she has some tingling into the fingers, but not currently.  She denies weakness.  History provided by patient. ROS:  -As included in HPI PE:   Vitals:   02/18/22 0427  BP: (!) 155/90  Pulse: 92  Resp: 18  Temp: 98.2 F (36.8 C)  SpO2: 98%    Non-toxic appearing No respiratory distress Normal strength of RUE MDM:  Based on signs and symptoms, cervical radiculopathy is highest on my differential, followed by carpal tunnel. I've ordered imaging in triage to expedite lab/diagnostic workup.  Patient was informed that the remainder of the evaluation will be completed by another provider, this initial triage assessment does not replace that evaluation, and the importance of remaining in the ED until their evaluation is complete.    Roxy Horseman, PA-C 02/18/22 0430

## 2022-02-18 NOTE — ED Notes (Signed)
Called Pt x2 for VS check no response.

## 2022-02-18 NOTE — ED Triage Notes (Addendum)
Pt presents for R shoulder and upper back pain that started 2 months ago but became concerning tonight when she noticed R hand numbness. Tingling in hand not currently present. Endorses taking 2000mg  tylenol; education provided concerning appropriate tylenol dosing.  No fall or injury associated with this

## 2022-02-18 NOTE — ED Notes (Signed)
Pt not responding for vitals 2x  

## 2022-02-26 DIAGNOSIS — E1165 Type 2 diabetes mellitus with hyperglycemia: Secondary | ICD-10-CM | POA: Insufficient documentation

## 2022-02-26 DIAGNOSIS — E119 Type 2 diabetes mellitus without complications: Secondary | ICD-10-CM | POA: Insufficient documentation

## 2022-04-14 LAB — HM PAP SMEAR: HM Pap smear: NEGATIVE

## 2023-04-23 ENCOUNTER — Ambulatory Visit: Payer: BC Managed Care – PPO | Admitting: Family Medicine

## 2023-05-28 ENCOUNTER — Ambulatory Visit: Payer: BC Managed Care – PPO | Admitting: Family Medicine

## 2023-09-27 ENCOUNTER — Encounter: Payer: Self-pay | Admitting: Family Medicine

## 2023-09-27 ENCOUNTER — Ambulatory Visit: Admitting: Family Medicine

## 2023-09-27 VITALS — BP 124/82 | HR 84 | Temp 98.4°F | Ht 65.0 in | Wt 168.1 lb

## 2023-09-27 DIAGNOSIS — Z23 Encounter for immunization: Secondary | ICD-10-CM

## 2023-09-27 DIAGNOSIS — Z7689 Persons encountering health services in other specified circumstances: Secondary | ICD-10-CM

## 2023-09-27 DIAGNOSIS — E1165 Type 2 diabetes mellitus with hyperglycemia: Secondary | ICD-10-CM | POA: Diagnosis not present

## 2023-09-27 DIAGNOSIS — G43009 Migraine without aura, not intractable, without status migrainosus: Secondary | ICD-10-CM | POA: Diagnosis not present

## 2023-09-27 DIAGNOSIS — M62830 Muscle spasm of back: Secondary | ICD-10-CM

## 2023-09-27 DIAGNOSIS — Z209 Contact with and (suspected) exposure to unspecified communicable disease: Secondary | ICD-10-CM | POA: Diagnosis not present

## 2023-09-27 MED ORDER — METHOCARBAMOL 500 MG PO TABS: 500.0000 mg | ORAL_TABLET | Freq: Three times a day (TID) | ORAL | 0 refills | Status: AC

## 2023-09-27 NOTE — Progress Notes (Signed)
 Patient Office Visit  Assessment & Plan:  Type 2 diabetes mellitus with hyperglycemia, without long-term current use of insulin (HCC) -     CBC with Differential/Platelet -     Comprehensive metabolic panel with GFR -     Lipid panel  Migraine without aura and without status migrainosus, not intractable  Encounter to establish care  Need for Tdap vaccination -     Tdap vaccine greater than or equal to 43yo IM  History of exposure to infectious disease -     HIV Antibody (routine testing w rflx)  Back spasm -     Methocarbamol ; Take 1 tablet (500 mg total) by mouth 3 (three) times daily.  Dispense: 90 tablet; Refill: 0   Assessment and Plan    Type 2 DM/Obesity Weight loss attributed to Ozempic. Considering starting Pilates. - Continue Ozempic as prescribed. - Encourage initiation of regular exercise routine, such as Pilates.  Prediabetes A1c increased from 5.6 to 5.9, still within prediabetes range. No immediate need for repeat A1c testing.  Migraine, unspecified, not intractable, without status migrainosus Migraines occur weekly, managed with sumatriptan. Inconsistent nortriptyline use may affect sleep and migraine frequency. - Continue sumatriptan as needed for acute migraine relief. - Encourage consistent use of nortriptyline to reduce migraine frequency. - Provide refills for nortriptyline.  Muscle spasm of back Muscle spasms likely exacerbated by weather changes. - Prescribe Robaxin  as needed for muscle spasms. - Send prescription to [facility].  Benign breast cysts and fibroadenoma Follow-up imaging recommended for left breast. Next ultrasound due next month. - Schedule left breast ultrasound for next month.  General Health Maintenance Tetanus vaccination is due. Discussed importance and risks of not being vaccinated. - Administer tetanus vaccination today.     Test results were reviewed and analyzed as part of the medical decision making of this visit.   Reviewed previous [facility] and previous labs during the office visit.  Continue Ozempic current dosage, continue healthy diet and start a gradual exercise program. Recommend healthy diet i.e mediterranean/DASH diet, consistent exercise - 30 minutes 5 day per week, and gradual weight loss Return in about 3 months (around 12/28/2023), or if symptoms worsen or fail to improve.   Subjective:    Patient ID: [name], female    DOB: 1980-11-20  Age: 43 y.o. MRN: [id number]  Chief Complaint  Patient presents with   Medical Management of Chronic Issues   Establish Care    HPI Discussed the use of AI scribe software for clinical note transcription with the patient, who gave verbal consent to proceed.  History of Present Illness        [name] is a 43 year old female who presents for follow-up regarding type 2 DM, weight management and migraine headaches. Also here to establish primary care  Obesity and Type 2 Diabetes- She has been experiencing gradual weight loss while on Ozempic for the past year, has been on the 2mg  Ozempic for the past month, currently weighing 168 pounds. Her goal is to lose 50 pounds. Despite not engaging in regular exercise, she attributes her weight loss to the medication. 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 She is considering starting a Pilates class to increase her physical activity.  She experiences migraine headaches approximately once a week and uses Imitrex as needed, which she finds effective. She occasionally takes nortriptyline at night, although she  has not been consistent with this medication recently.  She underwent a mammogram March 2025 which revealed fibroadenoma and cysts on the left breast. She is scheduled for a follow-up left breast ultrasound in the coming month. She finds the process concerning but is following the  recommended surveillance schedule.  Her A1c was recently measured at 5.9, up from 5.6. She attributes the slight increase to dietary changes and a recent trip to the [place]. Her highest recorded A1c was 6.7.  She uses Robaxin  as needed for muscle relaxation, especially with changing weather conditions. In the labs she requested an HIV test and this was added to her labs. Physical Exam Results LABS A1c: 5.9 (08/2023)  RADIOLOGY Mammogram: Fibroadenoma and cysts in the right breast Assessment & Plan Type 2 DM/Obesity Weight loss attributed to Ozempic. Considering starting Pilates. - Continue Ozempic as prescribed. - Encourage initiation of regular exercise routine, such as Pilates.  Type 2 diabetes A1c increased from 5.6 to 5.9, still within prediabetes range. No immediate need for repeat A1c testing. Patient has eye exam next month  Migraine, unspecified, not intractable, without status migrainosus Migraines occur weekly, managed with sumatriptan. Inconsistent nortriptyline use may affect sleep and migraine frequency. - Continue sumatriptan as needed for acute migraine relief. - Encourage consistent use of nortriptyline to reduce migraine frequency. - Provide refills for nortriptyline.  Muscle spasm of back Muscle spasms likely exacerbated by weather changes. - Prescribe Robaxin  as needed for muscle spasms. - Send prescription to [facility].  Benign breast cysts and fibroadenoma Follow-up imaging recommended for left breast. Next ultrasound due next month. - Schedule left breast ultrasound for next month.  General Health Maintenance Tetanus vaccination is due. Discussed importance and risks of not being vaccinated. - Administer tetanus vaccination today.    The 10-year ASCVD risk score (Arnett DK, et al., 2019) is: 1.4%  Past Medical History:  Diagnosis Date   Encounter for Nexplanon removal 08/22/2014   Gestational diabetes    Migraine    Nexplanon in place  08/20/2014   Seropositive for herpes simplex 2 infection    suppess at 34 weeks   Weight gain 08/20/2014   Past Surgical History:  Procedure Laterality Date   oral sugery     Social History   Tobacco Use   Smoking status: Never   Smokeless tobacco: Never  Vaping Use   Vaping status: Never Used  Substance Use Topics   Alcohol use: No   Drug use: No   Family History  Problem Relation Age of Onset   Diabetes Mother    Hypertension Mother    Diabetes Father    Cancer Father        started out as lung cancer then spread   Hypertension Father    Diabetes Sister    Hypertension Sister    Hyperlipidemia Sister    Stroke Sister    Diabetes Maternal Aunt    Hypertension Maternal Aunt    Diabetes Maternal Uncle    Hypertension Maternal Uncle    Cancer Maternal Grandmother    Diabetes Maternal Grandfather    Hypertension Maternal Grandfather    Hyperlipidemia Maternal Grandfather    Diabetes Maternal Uncle    Hypertension Maternal Uncle    Hypertension Maternal Uncle    No Known Allergies  ROS    Objective:    BP 124/82   Pulse 84   Temp 98.4 F (36.9 C)   Ht 5' 5 (1.651 m)   Wt 168 lb 2 oz (76.3 kg)  SpO2 98%   BMI 27.98 kg/m  BP Readings from Last 3 Encounters:  09/27/23 124/82  02/18/22 (!) 155/90  03/03/16 121/64   Wt Readings from Last 3 Encounters:  09/27/23 168 lb 2 oz (76.3 kg)  02/18/22 187 lb 6.3 oz (85 kg)  03/03/16 186 lb (84.4 kg)    Physical Exam Vitals and nursing note reviewed.  Constitutional:      Appearance: Normal appearance.  HENT:     Head: Normocephalic.     Right Ear: Tympanic membrane, ear canal and external ear normal.     Left Ear: Tympanic membrane, ear canal and external ear normal.  Eyes:     Extraocular Movements: Extraocular movements intact.     Pupils: Pupils are equal, round, and reactive to light.  Cardiovascular:     Rate and Rhythm: Normal rate and regular rhythm.     Heart sounds: Normal heart sounds.   Pulmonary:     Effort: Pulmonary effort is normal.     Breath sounds: Normal breath sounds.  Musculoskeletal:     Right lower leg: No edema.     Left lower leg: No edema.  Neurological:     General: No focal deficit present.     Mental Status: She is alert and oriented to person, place, and time.  Psychiatric:        Mood and Affect: Mood normal.        Behavior: Behavior normal.      Results for orders placed or performed in visit on 09/27/23  HM PAP SMEAR  Result Value Ref Range   HM Pap smear negative

## 2023-09-28 ENCOUNTER — Ambulatory Visit: Payer: Self-pay | Admitting: Family Medicine

## 2023-09-28 LAB — COMPREHENSIVE METABOLIC PANEL WITH GFR
AG Ratio: 1.2 (calc) (ref 1.0–2.5)
ALT: 9 U/L (ref 6–29)
AST: 12 U/L (ref 10–30)
Albumin: 4.3 g/dL (ref 3.6–5.1)
Alkaline phosphatase (APISO): 57 U/L (ref 31–125)
BUN: 10 mg/dL (ref 7–25)
CO2: 27 mmol/L (ref 20–32)
Calcium: 9.4 mg/dL (ref 8.6–10.2)
Chloride: 102 mmol/L (ref 98–110)
Creat: 0.75 mg/dL (ref 0.50–0.99)
Globulin: 3.6 g/dL (ref 1.9–3.7)
Glucose, Bld: 98 mg/dL (ref 65–99)
Potassium: 4.2 mmol/L (ref 3.5–5.3)
Sodium: 137 mmol/L (ref 135–146)
Total Bilirubin: 0.6 mg/dL (ref 0.2–1.2)
Total Protein: 7.9 g/dL (ref 6.1–8.1)
eGFR: 101 mL/min/1.73m2 (ref >=60)

## 2023-09-28 LAB — CBC WITH DIFFERENTIAL/PLATELET
Absolute Lymphocytes: 1989 {cells}/uL (ref 850–3900)
Absolute Monocytes: 293 {cells}/uL (ref 200–950)
Basophils Absolute: 51 {cells}/uL (ref 0–200)
Basophils Relative: 1.3 %
Eosinophils Absolute: 82 {cells}/uL (ref 15–500)
Eosinophils Relative: 2.1 %
HCT: 43.7 % (ref 35.0–45.0)
Hemoglobin: 13.8 g/dL (ref 11.7–15.5)
MCH: 28.4 pg (ref 27.0–33.0)
MCHC: 31.6 g/dL — ABNORMAL LOW (ref 32.0–36.0)
MCV: 89.9 fL (ref 80.0–100.0)
MPV: 10.4 fL (ref 7.5–12.5)
Monocytes Relative: 7.5 %
Neutro Abs: 1486 {cells}/uL — ABNORMAL LOW (ref 1500–7800)
Neutrophils Relative %: 38.1 %
Platelets: 319 Thousand/uL (ref 140–400)
RBC: 4.86 Million/uL (ref 3.80–5.10)
RDW: 12.9 % (ref 11.0–15.0)
Total Lymphocyte: 51 %
WBC: 3.9 Thousand/uL (ref 3.8–10.8)

## 2023-09-28 LAB — HIV ANTIBODY (ROUTINE TESTING W REFLEX): HIV 1&2 Ab, 4th Generation: NONREACTIVE

## 2023-09-28 LAB — LIPID PANEL
Cholesterol: 148 mg/dL (ref <200)
HDL: 50 mg/dL (ref >=50)
LDL Cholesterol (Calc): 84 mg/dL
Non-HDL Cholesterol (Calc): 98 mg/dL (ref <130)
Total CHOL/HDL Ratio: 3 (calc) (ref <5.0)
Triglycerides: 55 mg/dL (ref <150)

## 2023-10-18 ENCOUNTER — Ambulatory Visit: Admitting: Family Medicine

## 2023-10-18 ENCOUNTER — Encounter: Payer: Self-pay | Admitting: Family Medicine

## 2023-10-18 VITALS — BP 116/78 | HR 77 | Temp 98.5°F | Ht 65.0 in | Wt 164.0 lb

## 2023-10-18 DIAGNOSIS — Z209 Contact with and (suspected) exposure to unspecified communicable disease: Secondary | ICD-10-CM | POA: Diagnosis not present

## 2023-10-18 DIAGNOSIS — N898 Other specified noninflammatory disorders of vagina: Secondary | ICD-10-CM | POA: Diagnosis not present

## 2023-10-18 MED ORDER — METRONIDAZOLE 500 MG PO TABS: 500.0000 mg | ORAL_TABLET | Freq: Two times a day (BID) | ORAL | 0 refills | Status: DC

## 2023-10-18 NOTE — Progress Notes (Signed)
 Patient Office Visit  Assessment & Plan:  Vaginal discharge -     SureSwab Advanced Vaginitis Plus,TMA -     metroNIDAZOLE ; Take 1 tablet (500 mg total) by mouth 2 (two) times daily.  Dispense: 14 tablet; Refill: 0  Exposure to communicable disease -     Hepatitis B surface antigen -     Hepatitis C antibody -     HIV Antibody (routine testing w rflx) -     RPR -     HSV 2 antibody, IgG   Assessment and Plan    Vaginal discharge with suspected bacterial vaginosis and/or vaginal candidiasis Chronic discharge with differential diagnosis of BV and candidiasis. Discussed potential co-infection. - Order vaginal swab for BV, yeast, chlamydia, and gonorrhea. - Prescribe metronidazole  500 mg BID, to be filled but not started until test results are available. Explained potential for yeast infection as a side effect. - Perform additional STI testing including HIV and syphilis.          No follow-ups on file.   Subjective:    Patient ID: [name], female    DOB: December 12, 1980  Age: 43 y.o. MRN: [id number]  Chief Complaint  Patient presents with   Vaginitis    Pt c/o of vaginal odor x 3 months.    HPI Discussed the use of AI scribe software for clinical note transcription with the patient, who gave verbal consent to proceed.  History of Present Illness        [name] is a 43 year old female who presents with concerns of vaginal discharge since June.  She has been experiencing vaginal discharge since June, characterized by a noticeable change in odor, though not visible. She describes it as 'I don't smell the way I'm supposed to.'  She has a history of bacterial vaginosis and has used over-the-counter boric acid suppositories, which she finds effective but requires frequent use. She last used the suppositories on Friday, and they provide temporary relief, but the symptoms recur. She has not taken any antibiotics recently and denies any pain associated with the  discharge. She has previously taken Flagyl , but it has been a while since her last use.  She mentions a past long term relationship that ended in December, which may be relevant to her current symptoms. She reconnected with her ex-partner in June, around the time her symptoms began. She is no longer with this partner after discovering his infidelity   No urination problems, difficulty urinating, fever, or chills. Physical Exam GENITOURINARY: Vaginal discharge, whitish, possibly yeasty. Results Assessment & Plan Vaginal discharge with suspected bacterial vaginosis and/or vaginal candidiasis Chronic discharge with differential diagnosis of BV and candidiasis. Discussed potential co-infection. - Order vaginal swab for BV, yeast, chlamydia, and gonorrhea. - Prescribe metronidazole  500 mg BID, to be filled but not started until test results are available. Explained potential for yeast infection as a side effect. - Perform additional STI testing including HIV and syphilis.    The 10-year ASCVD risk score (Arnett DK, et al., 2019) is: 1.1%  Past Medical History:  Diagnosis Date   Encounter for Nexplanon removal 08/22/2014   Gestational diabetes    Migraine    Nexplanon in place 08/20/2014   Seropositive for herpes simplex 2 infection    suppess at 34 weeks   Weight gain 08/20/2014   Past Surgical History:  Procedure Laterality Date   oral sugery     Social History   Tobacco Use   Smoking status: Never  Smokeless tobacco: Never  Vaping Use   Vaping status: Never Used  Substance Use Topics   Alcohol use: No   Drug use: No   Family History  Problem Relation Age of Onset   Diabetes Mother    Hypertension Mother    Diabetes Father    Cancer Father        started out as lung cancer then spread   Hypertension Father    Diabetes Sister    Hypertension Sister    Hyperlipidemia Sister    Stroke Sister    Diabetes Maternal Aunt    Hypertension Maternal Aunt    Diabetes  Maternal Uncle    Hypertension Maternal Uncle    Cancer Maternal Grandmother    Diabetes Maternal Grandfather    Hypertension Maternal Grandfather    Hyperlipidemia Maternal Grandfather    Diabetes Maternal Uncle    Hypertension Maternal Uncle    Hypertension Maternal Uncle    No Known Allergies  ROS    Objective:    BP 116/78   Pulse 77   Temp 98.5 F (36.9 C)   Ht 5' 5 (1.651 m)   Wt 164 lb (74.4 kg)   SpO2 98%   BMI 27.29 kg/m  BP Readings from Last 3 Encounters:  10/18/23 116/78  09/27/23 124/82  02/18/22 (!) 155/90   Wt Readings from Last 3 Encounters:  10/18/23 164 lb (74.4 kg)  09/27/23 168 lb 2 oz (76.3 kg)  02/18/22 187 lb 6.3 oz (85 kg)    Physical Exam Vitals and nursing note reviewed.  Constitutional:      Appearance: Normal appearance.  HENT:     Head: Normocephalic.     Right Ear: Tympanic membrane, ear canal and external ear normal.     Left Ear: Tympanic membrane, ear canal and external ear normal.  Eyes:     Extraocular Movements: Extraocular movements intact.     Conjunctiva/sclera: Conjunctivae normal.     Pupils: Pupils are equal, round, and reactive to light.  Cardiovascular:     Rate and Rhythm: Normal rate and regular rhythm.     Heart sounds: Normal heart sounds.  Pulmonary:     Effort: Pulmonary effort is normal.     Breath sounds: Normal breath sounds.  Genitourinary:    Vagina: Normal.     Cervix: Discharge present. No cervical motion tenderness, lesion, erythema or cervical bleeding.     Adnexa: Right adnexa normal and left adnexa normal.  Musculoskeletal:     Right lower leg: No edema.     Left lower leg: No edema.  Neurological:     General: No focal deficit present.     Mental Status: She is alert and oriented to person, place, and time.  Psychiatric:        Mood and Affect: Mood normal.        Behavior: Behavior normal.        Thought Content: Thought content normal.        Judgment: Judgment normal.       No  results found for any visits on 10/18/23.

## 2023-10-19 LAB — HEPATITIS B SURFACE ANTIGEN: Hepatitis B Surface Ag: NONREACTIVE

## 2023-10-19 LAB — HIV ANTIBODY (ROUTINE TESTING W REFLEX): HIV 1&2 Ab, 4th Generation: NONREACTIVE

## 2023-10-19 LAB — SURESWAB® ADVANCED VAGINITIS PLUS,TMA
C. trachomatis RNA, TMA: NOT DETECTED
CANDIDA SPECIES: NOT DETECTED
Candida glabrata: NOT DETECTED
N. gonorrhoeae RNA, TMA: NOT DETECTED
SURESWAB(R) ADV BACTERIAL VAGINOSIS(BV),TMA: POSITIVE — AB
TRICHOMONAS VAGINALIS (TV),TMA: NOT DETECTED

## 2023-10-19 LAB — HEPATITIS C ANTIBODY: Hepatitis C Ab: NONREACTIVE

## 2023-10-19 LAB — RPR: RPR Ser Ql: NONREACTIVE

## 2023-10-19 LAB — HSV 2 ANTIBODY, IGG: HSV 2 Glycoprotein G Ab, IgG: 5.72 {index} — ABNORMAL HIGH

## 2023-10-20 ENCOUNTER — Ambulatory Visit: Payer: Self-pay | Admitting: Family Medicine

## 2023-11-26 ENCOUNTER — Encounter: Payer: Self-pay | Admitting: Family Medicine

## 2023-12-08 ENCOUNTER — Ambulatory Visit: Admitting: Family Medicine

## 2023-12-09 ENCOUNTER — Encounter: Payer: Self-pay | Admitting: Family Medicine

## 2023-12-09 ENCOUNTER — Ambulatory Visit: Admitting: Family Medicine

## 2023-12-09 VITALS — BP 121/84 | HR 71 | Temp 98.6°F | Ht 65.0 in | Wt 166.8 lb

## 2023-12-09 DIAGNOSIS — B9689 Other specified bacterial agents as the cause of diseases classified elsewhere: Secondary | ICD-10-CM | POA: Diagnosis not present

## 2023-12-09 DIAGNOSIS — N898 Other specified noninflammatory disorders of vagina: Secondary | ICD-10-CM

## 2023-12-09 DIAGNOSIS — N760 Acute vaginitis: Secondary | ICD-10-CM | POA: Diagnosis not present

## 2023-12-09 LAB — WET PREP FOR TRICH, YEAST, CLUE

## 2023-12-09 MED ORDER — METRONIDAZOLE 500 MG PO TABS: 500.0000 mg | ORAL_TABLET | Freq: Two times a day (BID) | ORAL | 0 refills | Status: AC

## 2023-12-09 NOTE — Progress Notes (Signed)
 Subjective:  HPI: [name] is a 43 y.o. female presenting on 12/09/2023 for Acute Visit (Possible yeast infection itching  for several weeks )   HPI Patient is in today for vaginal itching for several months. Was treated for BV in August and the vaginal odor she was experiencing at that time cleared. Subsequently she began to experience vaginal itching   Discussed the use of AI scribe software for clinical note transcription with the patient, who gave verbal consent to proceed.  History of Present Illness [name] is a 43 year old female who presents with recurrent itching following antibiotic treatment.  She has been experiencing recurrent itching since August after completing a course of antibiotics. Initially, she thought the symptoms were related to her diet, particularly her high intake of sweets, and attempted to manage them by increasing her water intake, which provided temporary relief. However, the itching persisted, prompting her to seek medical evaluation.  She recalls being informed that the antibiotics might lead to a yeast infection, but she has not used any over-the-counter medications to address the itching. No burning during urination, vaginal discharge, abnormal uterine bleeding, rash, or bumps.  She has a history of bacterial vaginosis (BV), which was treated in August with resolution of symptoms at that time. BV used to recur every few months when she was sexually active, but she is not currently sexually active. She does not engage in practices such as douching and wears normal cotton underwear.      Review of Systems  All other systems reviewed and are negative.   Relevant past medical history reviewed and updated as indicated.   Past Medical History:  Diagnosis Date   Encounter for Nexplanon removal 08/22/2014   Gestational diabetes    Migraine    Nexplanon in place 08/20/2014   Seropositive for herpes simplex 2 infection    suppess at 34  weeks   Weight gain 08/20/2014     Past Surgical History:  Procedure Laterality Date   oral sugery      Allergies and medications reviewed and updated.   Current Outpatient Medications:    aspirin-acetaminophen -caffeine  (EXCEDRIN MIGRAINE) 250-250-65 MG per tablet, Take 2 tablets by mouth every 6 (six) hours as needed for headache., Disp: , Rfl:    methocarbamol  (ROBAXIN ) 500 MG tablet, Take 1 tablet (500 mg total) by mouth 3 (three) times daily., Disp: 90 tablet, Rfl: 0   nortriptyline (PAMELOR) 75 MG capsule, Take 75 mg by mouth at bedtime., Disp: , Rfl:    OZEMPIC, 2 MG/DOSE, 8 MG/3ML SOPN, Inject 2 mg into the skin once a week., Disp: , Rfl:    SUMAtriptan (IMITREX) 50 MG tablet, Take 50 mg by mouth as needed., Disp: , Rfl:    metroNIDAZOLE  (FLAGYL ) 500 MG tablet, Take 1 tablet (500 mg total) by mouth 2 (two) times daily., Disp: 14 tablet, Rfl: 0  No Known Allergies  Objective:   BP 121/84   Pulse 71   Temp 98.6 F (37 C)   Ht 5' 5 (1.651 m)   Wt 166 lb 12.8 oz (75.7 kg)   LMP 11/22/2023 (Exact Date)   SpO2 98%   BMI 27.76 kg/m      12/09/2023    4:07 PM 10/18/2023    2:37 PM 09/27/2023    8:00 AM  Vitals with BMI  Height 5' 5 5' 5 5' 5  Weight 166 lbs 13 oz 164 lbs 168 lbs 2 oz  BMI 27.76 27.29 27.98  Systolic  121 116 124  Diastolic 84 78 82  Pulse 71 77 84     Physical Exam Vitals and nursing note reviewed.  Constitutional:      Appearance: Normal appearance. She is normal weight.  HENT:     Head: Normocephalic and atraumatic.  Skin:    General: Skin is warm and dry.  Neurological:     General: No focal deficit present.     Mental Status: She is alert and oriented to person, place, and time. Mental status is at baseline.  Psychiatric:        Mood and Affect: Mood normal.        Behavior: Behavior normal.        Thought Content: Thought content normal.        Judgment: Judgment normal.     Assessment & Plan:  Bacterial vaginosis -     WET  PREP FOR TRICH, YEAST, CLUE  Vaginal discharge -     metroNIDAZOLE ; Take 1 tablet (500 mg total) by mouth 2 (two) times daily.  Dispense: 14 tablet; Refill: 0 -     WET PREP FOR TRICH, YEAST, CLUE     Follow up plan: No follow-ups on file.  Jeoffrey GORMAN Barrio, FNP

## 2023-12-28 ENCOUNTER — Ambulatory Visit: Admitting: Family Medicine

## 2024-02-15 ENCOUNTER — Encounter: Payer: Self-pay | Admitting: Family Medicine

## 2024-02-15 ENCOUNTER — Ambulatory Visit: Admitting: Family Medicine

## 2024-02-15 VITALS — BP 128/86 | HR 66 | Ht 65.0 in | Wt 163.5 lb

## 2024-02-15 DIAGNOSIS — G43009 Migraine without aura, not intractable, without status migrainosus: Secondary | ICD-10-CM | POA: Diagnosis not present

## 2024-02-15 DIAGNOSIS — Z1231 Encounter for screening mammogram for malignant neoplasm of breast: Secondary | ICD-10-CM

## 2024-02-15 DIAGNOSIS — E1165 Type 2 diabetes mellitus with hyperglycemia: Secondary | ICD-10-CM | POA: Diagnosis not present

## 2024-02-15 MED ORDER — SEMAGLUTIDE (1 MG/DOSE) 4 MG/3ML ~~LOC~~ SOPN: 1.0000 mg | PEN_INJECTOR | SUBCUTANEOUS | 1 refills | Status: AC

## 2024-02-15 NOTE — Progress Notes (Signed)
 "  Patient Office Visit  Assessment & Plan:  Type 2 diabetes mellitus with hyperglycemia, without long-term current use of insulin (HCC) -     Semaglutide  (1 MG/DOSE); Inject 1 mg as directed once a week.  Dispense: 9 mL; Refill: 1 -     CBC with Differential/Platelet -     Comprehensive metabolic panel with GFR -     Hemoglobin A1c -     Lipid panel -     Microalbumin / creatinine urine ratio  Screening mammogram for breast cancer -     3D Screening Mammogram, Left and Right; Future  Migraine without aura and without status migrainosus, not intractable   Assessment and Plan    Type 2 diabetes mellitus Managed with semaglutide . She does not regularly check blood glucose but reports hypoglycemic symptoms. - Continue semaglutide  (Ozempic ).  Migraine Migraines well-controlled this month without Imitrex. Previous month's increase possibly stress-related. - Continue current migraine management.  General Health Maintenance Due for mammogram and eye exam. Plans to receive flu shot. Up to date with tdap vaccination. - Ordered mammogram follow-up. - Encouraged eye exam scheduling. - Plan to receive flu shot soon.      Recommend healthy diet i.e mediterranean/DASH diet, consistent exercise - 30 minutes 5 day per week, and gradual weight loss. Patient will get flu shot another time.  Follow up on lab work and notify patient.  mammogram ordered [facility].  Return in about 4 months (around 06/15/2024), or if symptoms worsen or fail to improve.   Subjective:    Patient ID: [name], female    DOB: 05/02/1980  Age: 43 y.o. MRN: [id number]  Chief Complaint  Patient presents with   Medical Management of Chronic Issues    3 mo f/u. Needs mammo.    HPI Discussed the use of AI scribe software for clinical note transcription with the patient, who gave verbal consent to proceed.  History of Present Illness   [name] is a 43 year old female who  presents for a follow-up visit regarding her use of Ozempic  and migraine management.  She is currently taking Ozempic  (semaglutide ) and is tolerating it well without side effects such as nausea, vomiting, diarrhea, or constipation. She occasionally feels unwell when her blood sugar is low but does not frequently experience hypoglycemia. She reports that she snacks a lot and does not skip meals, sometimes opting for healthier choices influenced by her health-conscious boss. However, she has not been exercising due to a busy work schedule, particularly during the holiday season.  Her migraines were more frequent last month, but she is uncertain of the exact triggers. This month, her migraines are well-controlled, and she manages them effectively with Imitrex, although she does not specify the frequency of use. She mentions being able to 'catch it before it gets there.'  She is due for a six-month follow-up mammogram at [facility]. Her last mammogram showed fatty tissue, and no biopsy was required. She has not had an eye exam recently due to time constraints from work commitments. She has not received a flu shot yet and plans to get it at a later date due to time constraints.  She lives with her son and works in a busy environment, particularly during the holiday season. She will be off work on Christmas and the day after Christmas.     History of Present Illness [name] is a 43 year old female who presents for a follow-up visit regarding  her use of Ozempic  and migraine management.  She is currently taking Ozempic  (semaglutide ) and is tolerating it well without side effects such as nausea, vomiting, diarrhea, or constipation. She occasionally feels unwell when her blood sugar is low but does not frequently experience hypoglycemia. She reports that she snacks a lot and does not skip meals, sometimes opting for healthier choices influenced by her health-conscious boss. However, she has not been  exercising due to a busy work schedule, particularly during the holiday season.  Her migraines were more frequent last month, but she is uncertain of the exact triggers. This month, her migraines are well-controlled, and she manages them effectively with Imitrex, although she does not specify the frequency of use. She mentions being able to 'catch it before it gets there.'  She is due for a six-month follow-up mammogram at [facility]. Her last mammogram showed fatty tissue, and no biopsy was required. She has not had an eye exam recently due to time constraints from work commitments. She has not received a flu shot yet and plans to get it at a later date due to time constraints.  She lives with her son and works in a busy environment, particularly during the holiday season. She will be off work on Christmas and the day after Christmas.  Results Radiology Mammogram (07/2023): Predominantly fatty parenchyma, no cysts, no biopsy indicated  Assessment and Plan Type 2 diabetes mellitus Managed with semaglutide . She does not regularly check blood glucose but reports hypoglycemic symptoms. - Continue semaglutide  (Ozempic ).  Migraine headaches Migraines well-controlled this month without Imitrex. Previous month's increase possibly stress-related. - Continue current migraine management.  General Health Maintenance Due for mammogram and eye exam. Plans to receive flu shot. Up to date with tdap vaccination. - Ordered mammogram follow-up. - Encouraged eye exam scheduling. - Plan to receive flu shot soon.    The 10-year ASCVD risk score (Arnett DK, et al., 2019) is: 1.8%  Past Medical History:  Diagnosis Date   Encounter for Nexplanon removal 08/22/2014   Gestational diabetes    Migraine    Nexplanon in place 08/20/2014   Seropositive for herpes simplex 2 infection    suppess at 34 weeks   Weight gain 08/20/2014   Past Surgical History:  Procedure Laterality Date   oral sugery      Social History[1] Family History  Problem Relation Age of Onset   Diabetes Mother    Hypertension Mother    Diabetes Father    Cancer Father        started out as lung cancer then spread   Hypertension Father    Diabetes Sister    Hypertension Sister    Hyperlipidemia Sister    Stroke Sister    Diabetes Maternal Aunt    Hypertension Maternal Aunt    Diabetes Maternal Uncle    Hypertension Maternal Uncle    Cancer Maternal Grandmother    Diabetes Maternal Grandfather    Hypertension Maternal Grandfather    Hyperlipidemia Maternal Grandfather    Diabetes Maternal Uncle    Hypertension Maternal Uncle    Hypertension Maternal Uncle    Allergies[2]  ROS    Objective:    BP 128/86   Pulse 66   Ht 5' 5 (1.651 m)   Wt 163 lb 8 oz (74.2 kg)   LMP 02/10/2024   SpO2 99%   BMI 27.21 kg/m  BP Readings from Last 3 Encounters:  02/15/24 128/86  12/09/23 121/84  10/18/23 116/78   Wt Readings  from Last 3 Encounters:  02/15/24 163 lb 8 oz (74.2 kg)  12/09/23 166 lb 12.8 oz (75.7 kg)  10/18/23 164 lb (74.4 kg)    Physical Exam Vitals and nursing note reviewed.  Constitutional:      General: She is not in acute distress.    Appearance: Normal appearance.  HENT:     Head: Normocephalic.     Right Ear: Tympanic membrane, ear canal and external ear normal.     Left Ear: Tympanic membrane, ear canal and external ear normal.  Eyes:     Extraocular Movements: Extraocular movements intact.     Conjunctiva/sclera: Conjunctivae normal.     Pupils: Pupils are equal, round, and reactive to light.  Cardiovascular:     Rate and Rhythm: Normal rate and regular rhythm.     Heart sounds: Normal heart sounds.  Pulmonary:     Effort: Pulmonary effort is normal.     Breath sounds: Normal breath sounds. No wheezing or rhonchi.  Abdominal:     Tenderness: There is no abdominal tenderness.  Musculoskeletal:     Right lower leg: No edema.     Left lower leg: No edema.   Neurological:     General: No focal deficit present.     Mental Status: She is alert and oriented to person, place, and time.  Psychiatric:        Mood and Affect: Mood normal.        Behavior: Behavior normal.      No results found for any visits on 02/15/24.          [1]  Social History Tobacco Use   Smoking status: Never   Smokeless tobacco: Never  Vaping Use   Vaping status: Never Used  Substance Use Topics   Alcohol use: No   Drug use: No  [2] No Known Allergies  "

## 2024-02-16 ENCOUNTER — Ambulatory Visit: Payer: Self-pay | Admitting: Family Medicine

## 2024-02-16 LAB — COMPREHENSIVE METABOLIC PANEL WITH GFR
AG Ratio: 1.2 (calc) (ref 1.0–2.5)
ALT: 19 U/L (ref 6–29)
AST: 19 U/L (ref 10–30)
Albumin: 4.2 g/dL (ref 3.6–5.1)
Alkaline phosphatase (APISO): 70 U/L (ref 31–125)
BUN/Creatinine Ratio: 8 (calc) (ref 6–22)
BUN: 5 mg/dL — ABNORMAL LOW (ref 7–25)
CO2: 31 mmol/L (ref 20–32)
Calcium: 9.5 mg/dL (ref 8.6–10.2)
Chloride: 103 mmol/L (ref 98–110)
Creat: 0.6 mg/dL (ref 0.50–0.99)
Globulin: 3.6 g/dL (ref 1.9–3.7)
Glucose, Bld: 92 mg/dL (ref 65–99)
Potassium: 3.9 mmol/L (ref 3.5–5.3)
Sodium: 142 mmol/L (ref 135–146)
Total Bilirubin: 0.4 mg/dL (ref 0.2–1.2)
Total Protein: 7.8 g/dL (ref 6.1–8.1)
eGFR: 114 mL/min/1.73m2

## 2024-02-16 LAB — CBC WITH DIFFERENTIAL/PLATELET
Absolute Lymphocytes: 1615 {cells}/uL (ref 850–3900)
Absolute Monocytes: 288 {cells}/uL (ref 200–950)
Basophils Absolute: 50 {cells}/uL (ref 0–200)
Basophils Relative: 1.6 %
Eosinophils Absolute: 81 {cells}/uL (ref 15–500)
Eosinophils Relative: 2.6 %
HCT: 41.2 % (ref 35.9–46.0)
Hemoglobin: 13 g/dL (ref 11.7–15.5)
MCH: 28.3 pg (ref 27.0–33.0)
MCHC: 31.6 g/dL (ref 31.6–35.4)
MCV: 89.6 fL (ref 81.4–101.7)
MPV: 10.6 fL (ref 7.5–12.5)
Monocytes Relative: 9.3 %
Neutro Abs: 1066 {cells}/uL — ABNORMAL LOW (ref 1500–7800)
Neutrophils Relative %: 34.4 %
Platelets: 346 Thousand/uL (ref 140–400)
RBC: 4.6 Million/uL (ref 3.80–5.10)
RDW: 12.4 % (ref 11.0–15.0)
Total Lymphocyte: 52.1 %
WBC: 3.1 Thousand/uL — ABNORMAL LOW (ref 3.8–10.8)

## 2024-02-16 LAB — HEMOGLOBIN A1C
Hgb A1c MFr Bld: 5.6 %
Mean Plasma Glucose: 114 mg/dL
eAG (mmol/L): 6.3 mmol/L

## 2024-02-16 LAB — LIPID PANEL
Cholesterol: 170 mg/dL
HDL: 60 mg/dL
LDL Cholesterol (Calc): 95 mg/dL
Non-HDL Cholesterol (Calc): 110 mg/dL
Total CHOL/HDL Ratio: 2.8 (calc)
Triglycerides: 64 mg/dL

## 2024-02-16 LAB — MICROALBUMIN / CREATININE URINE RATIO
Creatinine, Urine: 191 mg/dL (ref 20–275)
Microalb Creat Ratio: 10 mg/g{creat}
Microalb, Ur: 1.9 mg/dL

## 2024-06-20 ENCOUNTER — Ambulatory Visit: Admitting: Family Medicine
